# Patient Record
Sex: Female | Born: 1980 | Race: White | Hispanic: No | Marital: Married | Smoking: Former smoker
Health system: Southern US, Community
[De-identification: ages and names within clinical notes are randomized; demographics above are authoritative.]

## PROBLEM LIST (undated history)

## (undated) DIAGNOSIS — R569 Unspecified convulsions: Secondary | ICD-10-CM

---

## 2001-07-05 ENCOUNTER — Inpatient Hospital Stay (HOSPITAL_COMMUNITY): Admission: EM | Admit: 2001-07-05 | Discharge: 2001-07-08 | Payer: Self-pay | Admitting: Psychiatry

## 2002-08-19 ENCOUNTER — Emergency Department (HOSPITAL_COMMUNITY): Admission: EM | Admit: 2002-08-19 | Discharge: 2002-08-19 | Payer: Self-pay | Admitting: Emergency Medicine

## 2002-09-30 ENCOUNTER — Emergency Department (HOSPITAL_COMMUNITY): Admission: EM | Admit: 2002-09-30 | Discharge: 2002-09-30 | Payer: Self-pay | Admitting: Internal Medicine

## 2002-11-16 ENCOUNTER — Emergency Department (HOSPITAL_COMMUNITY): Admission: EM | Admit: 2002-11-16 | Discharge: 2002-11-16 | Payer: Self-pay | Admitting: Emergency Medicine

## 2007-11-15 ENCOUNTER — Other Ambulatory Visit: Admission: RE | Admit: 2007-11-15 | Discharge: 2007-11-15 | Payer: Self-pay | Admitting: Obstetrics and Gynecology

## 2008-01-21 ENCOUNTER — Ambulatory Visit (HOSPITAL_COMMUNITY): Admission: RE | Admit: 2008-01-21 | Discharge: 2008-01-21 | Payer: Self-pay | Admitting: Orthopaedic Surgery

## 2008-06-15 ENCOUNTER — Inpatient Hospital Stay (HOSPITAL_COMMUNITY): Admission: AD | Admit: 2008-06-15 | Discharge: 2008-06-15 | Payer: Self-pay | Admitting: Obstetrics & Gynecology

## 2008-07-04 ENCOUNTER — Inpatient Hospital Stay (HOSPITAL_COMMUNITY): Admission: AD | Admit: 2008-07-04 | Discharge: 2008-07-06 | Payer: Self-pay | Admitting: Obstetrics & Gynecology

## 2008-07-04 ENCOUNTER — Ambulatory Visit: Payer: Self-pay | Admitting: *Deleted
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
## 2011-05-02 NOTE — Discharge Summary (Signed)
[facility]  Patient:    Brianna Kim, Brianna Kim Visit Number: [id number] MRN: [id number]          Service Type: PSY Location: 40 [id number] 01 Attending Physician:  Rachael Fee Dictated by:   Netta Cedars, M.D. Admit Date:  07/05/2001 Discharge Date: 07/08/2001                             Discharge Summary  INTRODUCTION:  Brianna Kim is an 30 year old white female, who was admitted after overdose.  She reported overdosing on 20 pills.  It was allegedly Celebrex but she is not sure.  Her intention of taking the pills was to "pass out."  She denied, however, suicidal intention and denied her action as a suicidal attempt.  The patient reported having hallucinations and this was the reason she was brought to emergency room.  She has history of getting easily angered but able to control this anger by walking away.  Denied being depressed.  Denied suicidal or homicidal ideation or paranoia.  The patient does not have previous history of psychiatric treatment or hospitalizations.  SUBSTANCE ABUSE:  She smokes half pack of cigarettes for four years.  Does not drink.  Denies any substance abuse.  Urine drug screen shows positive for benzodiazepines and barbiturates, which could be related to the pills she ingested.  PHYSICAL EXAMINATION:  In emergency room was normal.  INITIAL DIAGNOSES:  Depression not otherwise specified with Global Assessment of Functioning of 40 and maximum for past year 75.  Details are available on admission note.  HOSPITAL COURSE:  After admission to the ward, patient was placed on special observation.  She was able to contract for safety.  The patient had some argument with mother prior to taking overdose.  During the round on July 06, 2001, she was pretty negativistic, drowsy, does not want to talk, still in angry mood, denies suicidal or homicidal ideation but accepts start taking medication for Celexa.  After agreeing to this, the  next day she refused to take Celexa, telling that her mother would not agree with it.  Later on this day, she seemed to be doing much better.  Acknowledged that maybe she was depressed but it was coming, according to her, from relationship with mother and conflict with boyfriend.  She had a good conversation with mother and felt that things will be better from now on.  I attempted to call the patients mother but her phone did not accept external calls.  I asked to schedule session with patients mother as soon as possible to consider discharging and further treatment.  Both patient and her mother acknowledged difficulty in relationship.  It seemed like this was an interesting dynamic.  The patients mother was very young when she had her and, as a result, their relationship is more like between sisters or friends, not between mother and daughter.  Mother did not seem to be able to establish authority and, when she tries to assert authority in a forceful way, patient in response to this rebels.  Both mother and daughter felt that it is safe for her to be discharged home.  The patient denied any symptoms.  She was not happy with the idea of taking medication and mother supported her.  She promised, however, to be open-minded.  If symptoms will not improve or deteriorate, she will start taking some medication.  It has to be stressed that patients mother is a  psychiatric patient, being on two types of psychotropic medications for time being.  During meeting with me, patient showed bright affect, denied depression, denied dangerous ideations. I had contact with her mother the day before and mother felt comfortable to meet with the patient and to take her home.  MEDICAL PROBLEMS:  During this brief hospital stay, patient did not develop any medical problems.  Vital signs were stable with blood pressure 110/70. Normal pulse, respiration rate and temperature.  LABORATORY DATA:  CBC showed  borderline anemia with hemoglobin 11.6 with normal starting from 12.  There was slight elevation of T3 uptake 39.2 with normal TSH and T4.  Liver function tests show initially, on July 13, increase of bilirubin normal on July 16.  Pregnancy test was negative.  DISCHARGE DIAGNOSES: Axis I:    1. Adjustment disorder with mixed emotion.            2. Suicidal gesture. Axis II:   No diagnosis. Axis III:  Status post overdose on unknown medication. Axis IV:   Moderate stressors (family conflict and separation from boyfriend). Axis V:    Global Assessment of Functioning upon admission 40; maximum for            past year 75; upon discharge between 70-75.  DISCHARGE RECOMMENDATIONS:  As mentioned before, at this point, patient refused medication but will consider them in the future.  She should call mental health if any deterioration of symptoms.  The patient has appointment with Dr. Katrinka Blazing on August 12 at 2 p.m. and Dottie _______ on Wednesday, August 7 at 2:15 p.m. for therapy.  She is also supposed to follow with her family physician because of borderline value of her blood work.  The patient understood instructions and, in good condition, was discharged home in care of her mother. Dictated by:   Netta Cedars, M.D. Attending Physician:  Rachael Fee DD:  09/17/01 TD:  09/18/01 Job: [id number]

## 2011-09-12 LAB — CBC
HCT: 24.9 — ABNORMAL LOW
HCT: 31.3 — ABNORMAL LOW
Hemoglobin: 8.3 — ABNORMAL LOW
MCHC: 33.9
MCV: 83.2
MCV: 84.1
Platelets: 180
Platelets: 231
RDW: 13.1
WBC: 17.2 — ABNORMAL HIGH

## 2011-09-12 LAB — RPR: RPR Ser Ql: NONREACTIVE

## 2011-09-30 ENCOUNTER — Emergency Department (HOSPITAL_COMMUNITY)
Admission: EM | Admit: 2011-09-30 | Discharge: 2011-10-01 | Disposition: A | Discharge status: Home / Self Care | Payer: Self-pay | Attending: Emergency Medicine | Admitting: Emergency Medicine

## 2011-09-30 DIAGNOSIS — N390 Urinary tract infection, site not specified: Secondary | ICD-10-CM | POA: Insufficient documentation

## 2011-09-30 NOTE — ED Notes (Signed)
Pt reports sharp pains to her pelvic area.  Pt states that it is more to the right side than her left.  Pt reports that her last menstrual cycle was abnormal.

## 2011-10-01 MED ORDER — NITROFURANTOIN MACROCRYSTAL 100 MG PO CAPS
ORAL_CAPSULE | ORAL | Status: AC
  Filled 2011-10-01: qty 1

## 2011-10-13 NOTE — ED Provider Notes (Signed)
History     CSN: [id number] Arrival date & time: 09/30/2011 11:46 PM   First MD Initiated Contact with Patient 10/01/11 0018      Chief Complaint  Patient presents with  . Pelvic Pain    (Consider location/radiation/quality/duration/timing/severity/associated sxs/prior treatment) HPI  History reviewed. No pertinent past medical history.  History reviewed. No pertinent past surgical history.  No family history on file.  History  Substance Use Topics  . Smoking status: Current Everyday Smoker  . Smokeless tobacco: Not on file  . Alcohol Use: Yes    OB History    Grav Para Term Preterm Abortions TAB SAB Ect Mult Living                  Review of Systems  Allergies  Review of patient's allergies indicates no known allergies.  Home Medications  No current outpatient prescriptions on file.  BP 107/72  Pulse 83  Temp(Src) 98.2 F (36.8 C) (Oral)  Resp 16  Ht 5\' 4"  (1.626 m)  Wt 114 lb (51.71 kg)  BMI 19.57 kg/m2  SpO2 100%  LMP 09/07/2011  Physical Exam  ED Course  Procedures (including critical care time)  Labs Reviewed - No data to display No results found.   No diagnosis found.    MDM  See downtime form        Nicoletta Dress. Colon Branch, MD 10/13/11 1625

## 2011-10-16 ENCOUNTER — Encounter (HOSPITAL_COMMUNITY): Payer: Self-pay | Admitting: *Deleted

## 2011-10-16 ENCOUNTER — Emergency Department (HOSPITAL_COMMUNITY)
Admission: EM | Admit: 2011-10-16 | Discharge: 2011-10-16 | Disposition: A | Discharge status: Home / Self Care | Payer: Self-pay | Attending: Emergency Medicine | Admitting: Emergency Medicine

## 2011-10-16 DIAGNOSIS — IMO0001 Reserved for inherently not codable concepts without codable children: Secondary | ICD-10-CM

## 2011-10-16 DIAGNOSIS — Z711 Person with feared health complaint in whom no diagnosis is made: Secondary | ICD-10-CM | POA: Insufficient documentation

## 2011-10-16 LAB — URINALYSIS, ROUTINE W REFLEX MICROSCOPIC
Bilirubin Urine: NEGATIVE
Glucose, UA: NEGATIVE mg/dL
Specific Gravity, Urine: 1.02 (ref 1.005–1.030)
Urobilinogen, UA: 0.2 mg/dL (ref 0.0–1.0)

## 2011-10-16 LAB — URINE MICROSCOPIC-ADD ON

## 2011-10-16 LAB — WET PREP, GENITAL: Yeast Wet Prep HPF POC: NONE SEEN

## 2011-10-16 MED ORDER — IBUPROFEN 600 MG PO TABS: 600.0000 mg | ORAL_TABLET | Freq: Four times a day (QID) | ORAL | Status: AC | PRN

## 2011-10-16 NOTE — ED Provider Notes (Signed)
Scribed for Nelia Shi, MD, the patient was seen in room APA08/APA08 . This chart was scribed by Ellie Lunch.   CSN: [id number] Arrival date & time: 10/16/2011  2:58 PM   First MD Initiated Contact with Patient 10/16/11 1501      Chief Complaint  Patient presents with  . Nausea  . Abdominal Pain     HPI Pt seen at 15:05 Brianna Kim is a 30 y.o. female who presents to the Emergency Department complaining of abdominal pain with associated nausea and dry heaving since this morning. Pain is located in lower abdomen, described as cramping, and rated 7/10 in severity. Pt also reports possible vaginal bleeding. Pt states she noticed "blood clots" this morning in the toilet bowel after she urinated. Pt LNMP 09/08/2011. Pt is unsure if she is pregnant.  Pt denies dysuria or fever. Denies any chronic medical condition. Pt has history of 2 previous pregnancies and one term birth. Pt is sexually active.   History reviewed. No pertinent past medical history.  History reviewed. No pertinent past surgical history.  No family history on file.  History  Substance Use Topics  . Smoking status: Current Everyday Smoker -- 0.5 packs/day    Types: Cigarettes  . Smokeless tobacco: Not on file  . Alcohol Use: Yes     occasionally    Review of Systems  Constitutional: Negative for fever.  Gastrointestinal: Positive for nausea, vomiting (dry heave) and abdominal pain.  Genitourinary: Negative for dysuria.  All other systems reviewed and are negative.    Allergies  Review of patient's allergies indicates no known allergies.  Home Medications  No current outpatient prescriptions on file.  BP 116/79  Pulse 76  Temp(Src) 98.1 F (36.7 C) (Oral)  Resp 18  Ht 5\' 4"  (1.626 m)  Wt 118 lb (53.524 kg)  BMI 20.25 kg/m2  SpO2 100%  LMP 09/07/2011  Physical Exam  Nursing note and vitals reviewed. Constitutional: She is oriented to person, place, and time. She appears well-developed and  well-nourished.  HENT:  Head: Normocephalic and atraumatic.  Eyes: EOM are normal. Pupils are equal, round, and reactive to light.  Neck: Neck supple.  Cardiovascular: Normal rate, regular rhythm and normal heart sounds.  Exam reveals no gallop and no friction rub.   No murmur heard. Pulmonary/Chest: Effort normal and breath sounds normal. No respiratory distress.  Abdominal: Soft. Bowel sounds are normal. There is no tenderness.  Genitourinary:       Small amount of blood present in vault and at entry.    External genitalia normal.  Bimanual examination normal.   Musculoskeletal: Normal range of motion. She exhibits no tenderness.  Neurological: She is alert and oriented to person, place, and time.  Skin: Skin is warm and dry.  Psychiatric: She has a normal mood and affect.    ED Course  Procedures (including critical care time) OTHER DATA REVIEWED: Nursing notes, vital signs, and past medical records reviewed.   DIAGNOSTIC STUDIES: Oxygen Saturation is 100% on room air, normal by my interpretation.     Labs Reviewed  URINALYSIS, ROUTINE W REFLEX MICROSCOPIC - Abnormal; Notable for the following:    Hgb urine dipstick LARGE (*)    All other components within normal limits  URINE MICROSCOPIC-ADD ON - Abnormal; Notable for the following:    Squamous Epithelial / LPF FEW (*)    Bacteria, UA FEW (*)    All other components within normal limits  PREGNANCY, URINE    No  diagnosis found.   MDM    I personally performed the services described in this documentation, which was scribed in my presence. The recorded information has been reviewed and considered.          Nelia Shi, MD 10/16/11 [phone]

## 2011-10-16 NOTE — ED Notes (Signed)
Pelvic exam performed by Dr. Radford Pax and assist by [facility] NT. Patient

## 2011-10-16 NOTE — ED Notes (Signed)
Pelvic exam performed by Dr.

## 2011-10-16 NOTE — ED Notes (Signed)
Pelvic exam performed by Dr. Radford Pax and assisted by Stephens Shire NT. Patient tolerated well. Patient was given a warm wash cloth and a pad upon leaving the room.

## 2011-10-16 NOTE — ED Notes (Signed)
C/o abd cramping and nausea onset this morning; states LMP 09/08/11; reports light amount of bleeding now and that she passed several blood clots this morning.

## 2011-11-23 ENCOUNTER — Emergency Department (HOSPITAL_COMMUNITY)
Admission: EM | Admit: 2011-11-23 | Discharge: 2011-11-23 | Discharge status: Against Medical Advice | Payer: Self-pay | Attending: Emergency Medicine | Admitting: Emergency Medicine

## 2011-11-23 ENCOUNTER — Encounter (HOSPITAL_COMMUNITY): Payer: Self-pay

## 2011-11-23 DIAGNOSIS — L989 Disorder of the skin and subcutaneous tissue, unspecified: Secondary | ICD-10-CM | POA: Insufficient documentation

## 2011-11-23 DIAGNOSIS — R21 Rash and other nonspecific skin eruption: Secondary | ICD-10-CM | POA: Insufficient documentation

## 2011-11-23 NOTE — ED Notes (Signed)
Pt presents with spot on back that appears red and scabbed over. Pt also has generalized rash. Pt states she has been feeling sick since she noticed the area on her back.

## 2011-11-23 NOTE — ED Notes (Signed)
Pt left AMA. Pt signed AMA form

## 2011-11-28 ENCOUNTER — Encounter (HOSPITAL_COMMUNITY): Payer: Self-pay

## 2011-11-28 ENCOUNTER — Emergency Department (HOSPITAL_COMMUNITY)
Admission: EM | Admit: 2011-11-28 | Discharge: 2011-11-28 | Disposition: A | Discharge status: Home / Self Care | Payer: Self-pay | Attending: Emergency Medicine | Admitting: Emergency Medicine

## 2011-11-28 DIAGNOSIS — R21 Rash and other nonspecific skin eruption: Secondary | ICD-10-CM | POA: Insufficient documentation

## 2011-11-28 DIAGNOSIS — F17200 Nicotine dependence, unspecified, uncomplicated: Secondary | ICD-10-CM | POA: Insufficient documentation

## 2011-11-28 MED ORDER — DIPHENHYDRAMINE HCL 25 MG PO CAPS
50.0000 mg | ORAL_CAPSULE | Freq: Once | ORAL | Status: AC
  Administered 2011-11-28: 50 mg via ORAL
  Filled 2011-11-28: qty 2

## 2011-11-28 MED ORDER — FAMOTIDINE 20 MG PO TABS
20.0000 mg | ORAL_TABLET | Freq: Once | ORAL | Status: AC
  Administered 2011-11-28: 20 mg via ORAL
  Filled 2011-11-28: qty 1

## 2011-11-28 MED ORDER — DOXYCYCLINE HYCLATE 100 MG PO CAPS: 100.0000 mg | ORAL_CAPSULE | Freq: Two times a day (BID) | ORAL | Status: AC

## 2011-11-28 MED ORDER — PREDNISONE 20 MG PO TABS
40.0000 mg | ORAL_TABLET | Freq: Once | ORAL | Status: AC
  Administered 2011-11-28: 40 mg via ORAL
  Filled 2011-11-28: qty 2

## 2011-11-28 MED ORDER — PREDNISONE 10 MG PO TABS: 20.0000 mg | ORAL_TABLET | Freq: Every day | ORAL | Status: DC

## 2011-11-28 NOTE — ED Notes (Signed)
Pt talking with Raiford Noble PA at present time

## 2011-11-28 NOTE — ED Notes (Signed)
Pt has rash to abd area, left wrist, bilateral underarms, pt has ?abscess area to lower back area, pt states that she started to break out in rash two weeks ago, c/o itching, has used otc medication without any relief, pt states that she has been using a fleece blanket and that her son used the same blanket,  her son broke out in a rash but his went away after a day,

## 2011-11-28 NOTE — ED Provider Notes (Signed)
History     CSN: [id number] Arrival date & time: 11/28/2011 11:14 AM   First MD Initiated Contact with Patient 11/28/11 1203      Chief Complaint  Patient presents with  . Rash    (Consider location/radiation/quality/duration/timing/severity/associated sxs/prior treatment) Patient is a 30 y.o. female presenting with rash. The history is provided by the patient. No language interpreter was used.  Rash  This is a new problem. Episode onset: ~ 2 weeks ago. The problem has been gradually improving. Associated with: circular lesion on lower back.  describes  as "large" target-like lesion that is improving.  thinks it could have been a spider or other insect.  ? tick. There has been no fever. The rash is present on the torso, right arm and left arm. The patient is experiencing no pain. The pain has been constant since onset. Associated symptoms include itching. Pertinent negatives include no blisters, no pain and no weeping. She has tried antihistamines for the symptoms. The treatment provided no relief.    History reviewed. No pertinent past medical history.  History reviewed. No pertinent past surgical history.  No family history on file.  History  Substance Use Topics  . Smoking status: Current Everyday Smoker -- 0.5 packs/day    Types: Cigarettes  . Smokeless tobacco: Not on file  . Alcohol Use: Yes     occasionally    OB History    Grav Para Term Preterm Abortions TAB SAB Ect Mult Living                  Review of Systems  Skin: Positive for itching and rash.  All other systems reviewed and are negative.    Allergies  Review of patient's allergies indicates no known allergies.  Home Medications   Current Outpatient Rx  Name Route Sig Dispense Refill  . ACETAMINOPHEN 500 MG PO TABS Oral Take 1,000 mg by mouth as needed. For pain       BP 113/65  Pulse 70  Temp(Src) 97.9 F (36.6 C) (Oral)  Resp 18  Ht 5\' 4"  (1.626 m)  Wt 104 lb (47.174 kg)  BMI 17.85  kg/m2  SpO2 100%  LMP 10/16/2011  Physical Exam  Nursing note and vitals reviewed. Constitutional: She is oriented to person, place, and time. She appears well-developed and well-nourished. No distress.  HENT:  Head: Normocephalic and atraumatic.  Eyes: EOM are normal.  Neck: Normal range of motion.  Cardiovascular: Normal rate, regular rhythm and normal heart sounds.   Pulmonary/Chest: Effort normal and breath sounds normal.  Abdominal: Soft. She exhibits no distension. There is no tenderness.  Musculoskeletal: Normal range of motion.       Back:  Neurological: She is alert and oriented to person, place, and time.  Skin: Skin is warm and dry.  Psychiatric: She has a normal mood and affect. Judgment normal.    ED Course  Procedures (including critical care time)  Labs Reviewed - No data to display No results found.   No diagnosis found.    MDM          Worthy Rancher, PA 11/28/11 1452

## 2011-11-28 NOTE — ED Provider Notes (Signed)
Medical screening examination/treatment/procedure(s) were performed by non-physician practitioner and as supervising physician I was immediately available for consultation/collaboration.   Benny Lennert, MD 11/28/11 1540

## 2011-11-28 NOTE — ED Notes (Signed)
Pt presents with generalized body rash. Pt states the only place it is not is on her legs. Pt states it started approx 14 days ago. Pt states she has used benadryl and cortisone cream but no relief.

## 2011-12-14 ENCOUNTER — Emergency Department (HOSPITAL_COMMUNITY)
Admission: EM | Admit: 2011-12-14 | Discharge: 2011-12-14 | Disposition: A | Discharge status: Home / Self Care | Payer: Medicaid Other | Attending: Emergency Medicine | Admitting: Emergency Medicine

## 2011-12-14 ENCOUNTER — Emergency Department (HOSPITAL_COMMUNITY): Payer: Medicaid Other

## 2011-12-14 ENCOUNTER — Encounter (HOSPITAL_COMMUNITY): Payer: Self-pay | Admitting: Emergency Medicine

## 2011-12-14 DIAGNOSIS — S01119A Laceration without foreign body of unspecified eyelid and periocular area, initial encounter: Secondary | ICD-10-CM | POA: Insufficient documentation

## 2011-12-14 DIAGNOSIS — K029 Dental caries, unspecified: Secondary | ICD-10-CM | POA: Insufficient documentation

## 2011-12-14 DIAGNOSIS — R220 Localized swelling, mass and lump, head: Secondary | ICD-10-CM | POA: Insufficient documentation

## 2011-12-14 DIAGNOSIS — IMO0002 Reserved for concepts with insufficient information to code with codable children: Secondary | ICD-10-CM

## 2011-12-14 DIAGNOSIS — S0081XA Abrasion of other part of head, initial encounter: Secondary | ICD-10-CM

## 2011-12-14 MED ORDER — BACITRACIN-NEOMYCIN-POLYMYXIN 400-5-5000 EX OINT
TOPICAL_OINTMENT | Freq: Once | CUTANEOUS | Status: AC
  Administered 2011-12-14: 1 via TOPICAL
  Filled 2011-12-14: qty 1

## 2011-12-14 NOTE — ED Notes (Signed)
MD at bedside. 
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
## 2011-12-14 NOTE — ED Notes (Signed)
Discharge instructions reviewed with pt, questions answered. Pt verbalized understanding.  

## 2011-12-18 NOTE — ED Provider Notes (Signed)
Medical screening examination/treatment/procedure(s) were performed by non-physician practitioner and as supervising physician I was immediately available for consultation/collaboration.  Toy Baker, MD 12/18/11 [phone]
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
## 2013-04-19 ENCOUNTER — Encounter (HOSPITAL_COMMUNITY): Payer: Self-pay | Admitting: *Deleted

## 2013-04-19 ENCOUNTER — Emergency Department (HOSPITAL_COMMUNITY)
Admission: EM | Admit: 2013-04-19 | Discharge: 2013-04-19 | Disposition: A | Discharge status: Home / Self Care | Payer: Medicaid Other | Attending: Emergency Medicine | Admitting: Emergency Medicine

## 2013-04-19 ENCOUNTER — Emergency Department (HOSPITAL_COMMUNITY): Payer: Medicaid Other

## 2013-04-19 DIAGNOSIS — G40909 Epilepsy, unspecified, not intractable, without status epilepticus: Secondary | ICD-10-CM | POA: Insufficient documentation

## 2013-04-19 DIAGNOSIS — R569 Unspecified convulsions: Secondary | ICD-10-CM

## 2013-04-19 DIAGNOSIS — N390 Urinary tract infection, site not specified: Secondary | ICD-10-CM | POA: Insufficient documentation

## 2013-04-19 DIAGNOSIS — Z87828 Personal history of other (healed) physical injury and trauma: Secondary | ICD-10-CM | POA: Insufficient documentation

## 2013-04-19 DIAGNOSIS — Z3202 Encounter for pregnancy test, result negative: Secondary | ICD-10-CM | POA: Insufficient documentation

## 2013-04-19 DIAGNOSIS — R51 Headache: Secondary | ICD-10-CM | POA: Insufficient documentation

## 2013-04-19 DIAGNOSIS — F17200 Nicotine dependence, unspecified, uncomplicated: Secondary | ICD-10-CM | POA: Insufficient documentation

## 2013-04-19 DIAGNOSIS — Z79899 Other long term (current) drug therapy: Secondary | ICD-10-CM | POA: Insufficient documentation

## 2013-04-19 HISTORY — DX: Unspecified convulsions: R56.9

## 2013-04-19 LAB — URINE MICROSCOPIC-ADD ON

## 2013-04-19 LAB — URINALYSIS, ROUTINE W REFLEX MICROSCOPIC
Bilirubin Urine: NEGATIVE
Nitrite: POSITIVE — AB
Specific Gravity, Urine: 1.025 (ref 1.005–1.030)
Urobilinogen, UA: 0.2 mg/dL (ref 0.0–1.0)

## 2013-04-19 LAB — CBC WITH DIFFERENTIAL/PLATELET
Basophils Absolute: 0.1 10*3/uL (ref 0.0–0.1)
Basophils Relative: 1 % (ref 0–1)
Eosinophils Absolute: 0.1 10*3/uL (ref 0.0–0.7)
Eosinophils Relative: 1 % (ref 0–5)
MCH: 29.9 pg (ref 26.0–34.0)
MCHC: 33.6 g/dL (ref 30.0–36.0)
MCV: 88.9 fL (ref 78.0–100.0)
Monocytes Absolute: 0.6 10*3/uL (ref 0.1–1.0)
Platelets: 275 10*3/uL (ref 150–400)
RDW: 13 % (ref 11.5–15.5)
WBC: 9.7 10*3/uL (ref 4.0–10.5)

## 2013-04-19 LAB — COMPREHENSIVE METABOLIC PANEL
ALT: 10 U/L (ref 0–35)
AST: 13 U/L (ref 0–37)
Calcium: 8.7 mg/dL (ref 8.4–10.5)
Creatinine, Ser: 1.03 mg/dL (ref 0.50–1.10)
Sodium: 141 mEq/L (ref 135–145)
Total Protein: 6.2 g/dL (ref 6.0–8.3)

## 2013-04-19 LAB — RAPID URINE DRUG SCREEN, HOSP PERFORMED
Amphetamines: NOT DETECTED
Barbiturates: NOT DETECTED
Benzodiazepines: POSITIVE — AB
Cocaine: NOT DETECTED

## 2013-04-19 MED ORDER — CEPHALEXIN 500 MG PO CAPS: 500.0000 mg | ORAL_CAPSULE | Freq: Four times a day (QID) | ORAL | Status: DC
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
## 2013-04-21 LAB — URINE CULTURE: Colony Count: >=100000

## 2013-04-22 ENCOUNTER — Telehealth (HOSPITAL_COMMUNITY): Payer: Self-pay | Admitting: Emergency Medicine

## 2013-04-22 NOTE — ED Notes (Signed)
Post ED Visit - Positive Culture Follow-up  Culture report reviewed by antimicrobial stewardship pharmacist: []  Wes Dulaney, Pharm.D., BCPS [x]  Celedonio Miyamoto, [address].D., BCPS []  Georgina Pillion, Pharm.D., BCPS []  [place], [address].D., BCPS, AAHIVP []  Estella Husk, Pharm.D., BCPS, AAHIV  Positive urine culture Treated with keflex, organism sensitive to the same and no further patient follow-up is required at this time.  Kylie A Holland 04/22/2013, 2:23 PM

## 2013-07-13 ENCOUNTER — Encounter (HOSPITAL_COMMUNITY): Payer: Self-pay | Admitting: Emergency Medicine

## 2013-07-13 ENCOUNTER — Emergency Department (HOSPITAL_COMMUNITY): Payer: Medicaid Other

## 2013-07-13 ENCOUNTER — Emergency Department (HOSPITAL_COMMUNITY)
Admission: EM | Admit: 2013-07-13 | Discharge: 2013-07-14 | Disposition: A | Discharge status: Home / Self Care | Payer: Medicaid Other | Attending: Emergency Medicine | Admitting: Emergency Medicine

## 2013-07-13 DIAGNOSIS — Z79899 Other long term (current) drug therapy: Secondary | ICD-10-CM | POA: Insufficient documentation

## 2013-07-13 DIAGNOSIS — R296 Repeated falls: Secondary | ICD-10-CM | POA: Insufficient documentation

## 2013-07-13 DIAGNOSIS — R569 Unspecified convulsions: Secondary | ICD-10-CM | POA: Insufficient documentation

## 2013-07-13 DIAGNOSIS — Z3202 Encounter for pregnancy test, result negative: Secondary | ICD-10-CM | POA: Insufficient documentation

## 2013-07-13 DIAGNOSIS — F17200 Nicotine dependence, unspecified, uncomplicated: Secondary | ICD-10-CM | POA: Insufficient documentation

## 2013-07-13 DIAGNOSIS — S0990XA Unspecified injury of head, initial encounter: Secondary | ICD-10-CM | POA: Insufficient documentation

## 2013-07-13 DIAGNOSIS — Y939 Activity, unspecified: Secondary | ICD-10-CM | POA: Insufficient documentation

## 2013-07-13 DIAGNOSIS — Y929 Unspecified place or not applicable: Secondary | ICD-10-CM | POA: Insufficient documentation

## 2013-07-13 LAB — CBC WITH DIFFERENTIAL/PLATELET
Basophils Absolute: 0 10*3/uL (ref 0.0–0.1)
Basophils Relative: 0 % (ref 0–1)
Eosinophils Absolute: 0.1 10*3/uL (ref 0.0–0.7)
Eosinophils Relative: 1 % (ref 0–5)
Lymphs Abs: 2 10*3/uL (ref 0.7–4.0)
MCH: 30 pg (ref 26.0–34.0)
MCV: 87 fL (ref 78.0–100.0)
Neutrophils Relative %: 70 % (ref 43–77)
Platelets: 223 10*3/uL (ref 150–400)
RBC: 4.6 MIL/uL (ref 3.87–5.11)
RDW: 12.7 % (ref 11.5–15.5)
WBC: 9.5 10*3/uL (ref 4.0–10.5)

## 2013-07-13 LAB — URINALYSIS, ROUTINE W REFLEX MICROSCOPIC
Nitrite: NEGATIVE
Protein, ur: NEGATIVE mg/dL
Specific Gravity, Urine: 1.02 (ref 1.005–1.030)
Urobilinogen, UA: 0.2 mg/dL (ref 0.0–1.0)

## 2013-07-13 LAB — ETHANOL: Alcohol, Ethyl (B): <11 mg/dL (ref 0–11)

## 2013-07-13 LAB — BASIC METABOLIC PANEL
Calcium: 9.3 mg/dL (ref 8.4–10.5)
GFR calc Af Amer: >90 mL/min (ref >90)
GFR calc non Af Amer: >90 mL/min (ref >90)
Glucose, Bld: 117 mg/dL — ABNORMAL HIGH (ref 70–99)
Potassium: 3.3 mEq/L — ABNORMAL LOW (ref 3.5–5.1)
Sodium: 134 mEq/L — ABNORMAL LOW (ref 135–145)

## 2013-07-13 LAB — PREGNANCY, URINE: Preg Test, Ur: NEGATIVE

## 2013-07-13 LAB — URINE MICROSCOPIC-ADD ON

## 2013-07-13 LAB — RAPID URINE DRUG SCREEN, HOSP PERFORMED
Amphetamines: NOT DETECTED
Opiates: POSITIVE — AB

## 2013-07-13 MED ORDER — PHENYTOIN SODIUM 50 MG/ML IJ SOLN
INTRAMUSCULAR | Status: AC
  Filled 2013-07-13: qty 20

## 2013-07-13 MED ORDER — PHENYTOIN SODIUM EXTENDED 100 MG PO CAPS: 100.0000 mg | ORAL_CAPSULE | Freq: Three times a day (TID) | ORAL | Status: DC

## 2013-07-13 MED ORDER — SODIUM CHLORIDE 0.9 % IV SOLN
1000.0000 mg | Freq: Once | INTRAVENOUS | Status: AC
  Administered 2013-07-13: 1000 mg via INTRAVENOUS
  Filled 2013-07-13: qty 20

## 2013-07-13 MED ORDER — SODIUM CHLORIDE 0.9 % IV SOLN
INTRAVENOUS | Status: DC
  Administered 2013-07-13: 1000 mL via INTRAVENOUS

## 2013-07-13 MED ORDER — ONDANSETRON HCL 4 MG/2ML IJ SOLN
4.0000 mg | Freq: Once | INTRAMUSCULAR | Status: AC
  Administered 2013-07-13: 4 mg via INTRAVENOUS
  Filled 2013-07-13: qty 2
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
## 2013-07-13 NOTE — ED Notes (Signed)
Able to get up to bedside commode for urine spec. Gait only slightly ataxic.

## 2013-07-13 NOTE — ED Notes (Signed)
Vomited moderate amt of clear liquid

## 2013-07-14 MED ORDER — ACETAMINOPHEN 500 MG PO TABS
1000.0000 mg | ORAL_TABLET | Freq: Once | ORAL | Status: AC
  Administered 2013-07-14: 1000 mg via ORAL
  Filled 2013-07-14: qty 2

## 2013-07-14 NOTE — ED Notes (Signed)
Calmer than upon admission. Less argumentative.

## 2013-07-14 NOTE — ED Notes (Signed)
Sat upright c/o dizziness. Dr strand aware and pt informed that this is probably a side effect of the dilantin. Pt and her family is aware that this should dissipate tonight. Instructed to let her physician know if this persists with the oral meds.

## 2013-12-18 ENCOUNTER — Encounter (HOSPITAL_COMMUNITY): Payer: Self-pay | Admitting: Emergency Medicine

## 2013-12-18 ENCOUNTER — Emergency Department (HOSPITAL_COMMUNITY)
Admission: EM | Admit: 2013-12-18 | Discharge: 2013-12-18 | Disposition: A | Discharge status: Home / Self Care | Payer: Medicaid Other | Attending: Emergency Medicine | Admitting: Emergency Medicine

## 2013-12-18 DIAGNOSIS — F29 Unspecified psychosis not due to a substance or known physiological condition: Secondary | ICD-10-CM | POA: Insufficient documentation

## 2013-12-18 DIAGNOSIS — Z3202 Encounter for pregnancy test, result negative: Secondary | ICD-10-CM | POA: Insufficient documentation

## 2013-12-18 DIAGNOSIS — G479 Sleep disorder, unspecified: Secondary | ICD-10-CM | POA: Insufficient documentation

## 2013-12-18 DIAGNOSIS — R55 Syncope and collapse: Secondary | ICD-10-CM | POA: Insufficient documentation

## 2013-12-18 DIAGNOSIS — Z79899 Other long term (current) drug therapy: Secondary | ICD-10-CM | POA: Insufficient documentation

## 2013-12-18 DIAGNOSIS — R569 Unspecified convulsions: Secondary | ICD-10-CM

## 2013-12-18 DIAGNOSIS — G40909 Epilepsy, unspecified, not intractable, without status epilepticus: Secondary | ICD-10-CM | POA: Insufficient documentation

## 2013-12-18 DIAGNOSIS — F17200 Nicotine dependence, unspecified, uncomplicated: Secondary | ICD-10-CM | POA: Insufficient documentation

## 2013-12-18 DIAGNOSIS — R112 Nausea with vomiting, unspecified: Secondary | ICD-10-CM

## 2013-12-18 DIAGNOSIS — R42 Dizziness and giddiness: Secondary | ICD-10-CM

## 2013-12-18 LAB — CBC WITH DIFFERENTIAL/PLATELET
BASOS ABS: 0 10*3/uL (ref 0.0–0.1)
BASOS PCT: 0 % (ref 0–1)
EOS ABS: 0.1 10*3/uL (ref 0.0–0.7)
Eosinophils Relative: 1 % (ref 0–5)
HCT: 38.7 % (ref 36.0–46.0)
HEMOGLOBIN: 13 g/dL (ref 12.0–15.0)
Lymphocytes Relative: 23 % (ref 12–46)
Lymphs Abs: 2.5 10*3/uL (ref 0.7–4.0)
MCH: 28.8 pg (ref 26.0–34.0)
MCHC: 33.6 g/dL (ref 30.0–36.0)
MCV: 85.8 fL (ref 78.0–100.0)
Monocytes Absolute: 0.5 10*3/uL (ref 0.1–1.0)
Monocytes Relative: 5 % (ref 3–12)
NEUTROS ABS: 7.4 10*3/uL (ref 1.7–7.7)
Neutrophils Relative %: 71 % (ref 43–77)
PLATELETS: 286 10*3/uL (ref 150–400)
RBC: 4.51 MIL/uL (ref 3.87–5.11)
RDW: 12.5 % (ref 11.5–15.5)
WBC: 10.5 10*3/uL (ref 4.0–10.5)

## 2013-12-18 LAB — COMPREHENSIVE METABOLIC PANEL
ALBUMIN: 4 g/dL (ref 3.5–5.2)
ALK PHOS: 44 U/L (ref 39–117)
ALT: 9 U/L (ref 0–35)
AST: 19 U/L (ref 0–37)
BILIRUBIN TOTAL: 0.3 mg/dL (ref 0.3–1.2)
BUN: 4 mg/dL — ABNORMAL LOW (ref 6–23)
CHLORIDE: 105 meq/L (ref 96–112)
CO2: 25 mEq/L (ref 19–32)
Calcium: 8.9 mg/dL (ref 8.4–10.5)
Creatinine, Ser: 0.79 mg/dL (ref 0.50–1.10)
GFR calc Af Amer: >90 mL/min (ref >90)
GFR calc non Af Amer: >90 mL/min (ref >90)
Glucose, Bld: 84 mg/dL (ref 70–99)
POTASSIUM: 3.9 meq/L (ref 3.7–5.3)
SODIUM: 140 meq/L (ref 137–147)
TOTAL PROTEIN: 6.7 g/dL (ref 6.0–8.3)

## 2013-12-18 LAB — URINALYSIS, ROUTINE W REFLEX MICROSCOPIC
Bilirubin Urine: NEGATIVE
GLUCOSE, UA: NEGATIVE mg/dL
HGB URINE DIPSTICK: NEGATIVE
KETONES UR: NEGATIVE mg/dL
Leukocytes, UA: NEGATIVE
Nitrite: NEGATIVE
PROTEIN: NEGATIVE mg/dL
Specific Gravity, Urine: 1.01 (ref 1.005–1.030)
Urobilinogen, UA: 0.2 mg/dL (ref 0.0–1.0)
pH: 6 (ref 5.0–8.0)

## 2013-12-18 LAB — PREGNANCY, URINE: Preg Test, Ur: NEGATIVE

## 2013-12-18 MED ORDER — ONDANSETRON HCL 4 MG/2ML IJ SOLN
4.0000 mg | Freq: Four times a day (QID) | INTRAMUSCULAR | Status: DC | PRN
  Administered 2013-12-18: 4 mg via INTRAVENOUS
  Filled 2013-12-18: qty 2

## 2013-12-18 MED ORDER — PHENYTOIN SODIUM EXTENDED 100 MG PO CAPS: 100.0000 mg | ORAL_CAPSULE | Freq: Three times a day (TID) | ORAL | Status: DC

## 2013-12-18 MED ORDER — SODIUM CHLORIDE 0.9 % IV SOLN
1000.0000 mL | Freq: Once | INTRAVENOUS | Status: AC
  Administered 2013-12-18: 1000 mL via INTRAVENOUS

## 2013-12-18 MED ORDER — MECLIZINE HCL 12.5 MG PO TABS
25.0000 mg | ORAL_TABLET | Freq: Once | ORAL | Status: AC
  Administered 2013-12-18: 25 mg via ORAL
  Filled 2013-12-18: qty 2

## 2013-12-18 MED ORDER — ONDANSETRON HCL 4 MG PO TABS: 4.0000 mg | ORAL_TABLET | Freq: Three times a day (TID) | ORAL | Status: DC | PRN

## 2013-12-18 MED ORDER — SODIUM CHLORIDE 0.9 % IV SOLN: 1000.0000 mL | INTRAVENOUS | Status: DC

## 2013-12-18 MED ORDER — MECLIZINE HCL 25 MG PO TABS: 25.0000 mg | ORAL_TABLET | Freq: Four times a day (QID) | ORAL | Status: DC | PRN

## 2013-12-18 NOTE — ED Provider Notes (Signed)
CSN: [id number]     Arrival date & time 12/18/13  1528 History  This chart was scribed for Ward Givens, MD by Bennett Scrape, ED Scribe. This patient was seen in room APA01/APA01 and the patient's care was started at 6:43 PM.   Chief Complaint  Patient presents with  . Seizures    The history is provided by the patient. No language interpreter was used.    HPI Comments: Brianna Kim is a 33 y.o. female who presents to the Emergency Department complaining of one witnessed seizure that occurred around 8 or 9 AM this morning.  She reports that she was possibly diagnosed with seizures 4 years ago. Her last seizure was in August 2014 and prior one was in July 2014. She states that she is usually unsure if she has seizures. If she is by herself, she will wake up with bruises or abrasions but does not remember. She reports that she has been using a prescription that was written by an ER doctor, last refill was October 21st, 2014 for 90 pills. She has been taking them PRN. Last dose was this morning after the seizure. She reports that she has not been taking her medication as prescribed due to trying to get pregnant and running out of pills. Pt admits that she did not sleep last night because she only had one dilantin pill left. She states that she was feeling anxiety over having a seizure without someone there. Spouse came home from work around 7:15 AM and pt and spouse laid down together to watch a movie. The scene had flashing lights and the pt reported feeling bad. The pt stood up and started stuttering. Spouse asked if she was ok and pt's stuttering increased. Spouse held the pt in his lap on the couch and told her to bite down on his shirt which she did. He states that she was staring "wide eyed", had generalized jerking and stiffness and was hyperventilating. The episode lasted about 3 to 5 minutes. He is unsure of any color changes stating that the lights were off at the time.  Spouse denies hitting  her head. Pt denies having any preceding symptoms. She c/o mild tongue soreness. She reports concerned over feeling dizzy for several hours after the seizure and reports having to think harder to answer questions. She reports that she was feeling nauseated and was vomiting last night. She denies any nausea currently.  She denies being evaluated by a neurologist or having a prior EEG performed. She does state she had a CT and MRI.  She denies being on any seizure medication until she was prescribed some after her first ER visit. She is a 1ppd smoker and occassional alcohol user. She denies drinking last night.   PC[facility] is [facility].    Past Medical History  Diagnosis Date  . Seizures    History reviewed. No pertinent past surgical history. No family history on file. History  Substance Use Topics  . Smoking status: Current Every Day Smoker -- 0.03 packs/day for 10 years    Types: Cigarettes  . Smokeless tobacco: Never Used  . Alcohol Use: Yes     Comment: occasionally   Employed Lives with significant other   OB History   Grav Para Term Preterm Abortions TAB SAB Ect Mult Living   2 1 1  1  1   1      Review of Systems  Gastrointestinal: Positive for nausea (resolved ) and vomiting (resolved ).  Neurological: Positive for seizures. Negative for headaches.  Psychiatric/Behavioral: Positive for confusion and sleep disturbance.  All other systems reviewed and are negative.    Allergies  Review of patient's allergies indicates no known allergies.  Home Medications   Current Outpatient Rx  Name  Route  Sig  Dispense  Refill  . phenytoin (DILANTIN) 100 MG ER capsule   Oral   Take 1 capsule (100 mg total) by mouth 3 (three) times daily.   90 capsule   2    Triage Vitals: BP 126/92  Pulse 84  Temp(Src) 98.5 F (36.9 C) (Oral)  Resp 20  Ht 5\' 4"  (1.626 m)  Wt 110 lb (49.896 kg)  BMI 18.87 kg/m2  SpO2 100%  LMP 11/23/2013  Vital signs normal     Physical Exam  Nursing note and vitals reviewed. Constitutional: She is oriented to person, place, and time. She appears well-developed and well-nourished.  Non-toxic appearance. She does not appear ill. No distress.  HENT:  Head: Normocephalic and atraumatic.  Right Ear: External ear normal.  Left Ear: External ear normal.  Nose: Nose normal. No mucosal edema or rhinorrhea.  Mouth/Throat: Oropharynx is clear and moist and mucous membranes are normal. No dental abscesses or uvula swelling.  No obvious trauma to her tongue but she c/o soreness of the left  Eyes: Conjunctivae and EOM are normal. Pupils are equal, round, and reactive to light.  Neck: Normal range of motion and full passive range of motion without pain. Neck supple.  Cardiovascular: Normal rate, regular rhythm and normal heart sounds.  Exam reveals no gallop and no friction rub.   No murmur heard. Pulmonary/Chest: Effort normal and breath sounds normal. No respiratory distress. She has no wheezes. She has no rhonchi. She has no rales. She exhibits no tenderness and no crepitus.  Abdominal: Soft. Normal appearance and bowel sounds are normal. She exhibits no distension. There is no tenderness. There is no rebound and no guarding.  Musculoskeletal: Normal range of motion. She exhibits no edema and no tenderness.  Moves all extremities well.   Neurological: She is alert and oriented to person, place, and time. She has normal strength. No cranial nerve deficit.  Skin: Skin is warm, dry and intact. No rash noted. No erythema. No pallor.  Psychiatric: She has a normal mood and affect. Her speech is normal and behavior is normal. Her mood appears not anxious.    ED Course  Procedures (including critical care time)  Medications  0.9 %  sodium chloride infusion (0 mLs Intravenous Stopped 12/18/13 2050)    Followed by  0.9 %  sodium chloride infusion (1,000 mLs Intravenous Not Given 12/18/13 2101)  ondansetron (ZOFRAN) injection 4  mg (4 mg Intravenous Given 12/18/13 1916)  meclizine (ANTIVERT) tablet 25 mg (25 mg Oral Given 12/18/13 2057)    DIAGNOSTIC STUDIES: Oxygen Saturation is 100% on RA, normal by my interpretation.    COORDINATION OF CARE: 7:00 PM-Discussed treatment plan which includes IV fluids, antiemetic and blood work with pt at bedside and pt agreed to plan.   8:40 PM- Pt rechecked and reports that she is having dizziness described as room spinning. Pt reports that she noticed the symptoms this morning but states that the symptoms have gotten worse. Discussed meclizine with pt and pt agreed. Advised pt to [facility] with [facility] for Dilantin prescription and will provide a referral to Neurology. Pt is agreeable.   Results for orders placed during the hospital encounter of 12/18/13  CBC  WITH DIFFERENTIAL      Result Value Range   WBC 10.5  4.0 - 10.5 K/uL   RBC 4.51  3.87 - 5.11 MIL/uL   Hemoglobin 13.0  12.0 - 15.0 g/dL   HCT 16.1  09.6 - 04.5 %   MCV 85.8  78.0 - 100.0 fL   MCH 28.8  26.0 - 34.0 pg   MCHC 33.6  30.0 - 36.0 g/dL   RDW 40.9  81.1 - 91.4 %   Platelets 286  150 - 400 K/uL   Neutrophils Relative % 71  43 - 77 %   Neutro Abs 7.4  1.7 - 7.7 K/uL   Lymphocytes Relative 23  12 - 46 %   Lymphs Abs 2.5  0.7 - 4.0 K/uL   Monocytes Relative 5  3 - 12 %   Monocytes Absolute 0.5  0.1 - 1.0 K/uL   Eosinophils Relative 1  0 - 5 %   Eosinophils Absolute 0.1  0.0 - 0.7 K/uL   Basophils Relative 0  0 - 1 %   Basophils Absolute 0.0  0.0 - 0.1 K/uL  COMPREHENSIVE METABOLIC PANEL      Result Value Range   Sodium 140  137 - 147 mEq/L   Potassium 3.9  3.7 - 5.3 mEq/L   Chloride 105  96 - 112 mEq/L   CO2 25  19 - 32 mEq/L   Glucose, Bld 84  70 - 99 mg/dL   BUN 4 (*) 6 - 23 mg/dL   Creatinine, Ser 7.82  0.50 - 1.10 mg/dL   Calcium 8.9  8.4 - 95.6 mg/dL   Total Protein 6.7  6.0 - 8.3 g/dL   Albumin 4.0  3.5 - 5.2 g/dL   AST 19  0 - 37 U/L   ALT 9  0 - 35 U/L   Alkaline Phosphatase 44  39 - 117  U/L   Total Bilirubin 0.3  0.3 - 1.2 mg/dL   GFR calc non Af Amer >90  >90 mL/min   GFR calc Af Amer >90  >90 mL/min  URINALYSIS, ROUTINE W REFLEX MICROSCOPIC      Result Value Range   Color, Urine YELLOW  YELLOW   APPearance CLEAR  CLEAR   Specific Gravity, Urine 1.010  1.005 - 1.030   pH 6.0  5.0 - 8.0   Glucose, UA NEGATIVE  NEGATIVE mg/dL   Hgb urine dipstick NEGATIVE  NEGATIVE   Bilirubin Urine NEGATIVE  NEGATIVE   Ketones, ur NEGATIVE  NEGATIVE mg/dL   Protein, ur NEGATIVE  NEGATIVE mg/dL   Urobilinogen, UA 0.2  0.0 - 1.0 mg/dL   Nitrite NEGATIVE  NEGATIVE   Leukocytes, UA NEGATIVE  NEGATIVE  PREGNANCY, URINE      Result Value Range   Preg Test, Ur NEGATIVE  NEGATIVE   Laboratory interpretation all normal      EKG Interpretation   None       MDM   1. Vertigo   2. Nausea and vomiting   3. Seizure    Discharge Medication List as of 12/18/2013  8:44 PM    START taking these medications   Details  meclizine (ANTIVERT) 25 MG tablet Take 1 tablet (25 mg total) by mouth every 6 (six) hours as needed for dizziness., Starting 12/18/2013, Until Discontinued, Print    ondansetron (ZOFRAN) 4 MG tablet Take 1 tablet (4 mg total) by mouth every 8 (eight) hours as needed for nausea or vomiting., Starting 12/18/2013, Until Discontinued, Print    !!  phenytoin (DILANTIN) 100 MG ER capsule Take 1 capsule (100 mg total) by mouth 3 (three) times daily., Starting 12/18/2013, Until Discontinued, Print     !! - Potential duplicate medications found. Please discuss with provider.      Plan discharge  Devoria Albe, MD, FACEP   I personally performed the services described in this documentation, which was scribed in my presence. The recorded information has been reviewed and considered.  Devoria Albe, MD, Armando Gang   Ward Givens, MD 12/18/13 [phone]

## 2013-12-18 NOTE — Discharge Instructions (Signed)
Follow up at the health department and with Dr Gerilyn Pilgrimoonquah about your seizures. Take the meclizine and zofran for dizziness and nasuea. Recheck as needed.  Vertigo Vertigo means you feel like you or your surroundings are moving when they are not. Vertigo can be dangerous if it occurs when you are at work, driving, or performing difficult activities.  CAUSES  Vertigo occurs when there is a conflict of signals sent to your brain from the visual and sensory systems in your body. There are many different causes of vertigo, including:  Infections, especially in the inner ear.  A bad reaction to a drug or misuse of alcohol and medicines.  Withdrawal from drugs or alcohol.  Rapidly changing positions, such as lying down or rolling over in bed.  A migraine headache.  Decreased blood flow to the brain.  Increased pressure in the brain from a head injury, infection, tumor, or bleeding. SYMPTOMS  You may feel as though the world is spinning around or you are falling to the ground. Because your balance is upset, vertigo can cause nausea and vomiting. You may have involuntary eye movements (nystagmus). DIAGNOSIS  Vertigo is usually diagnosed by physical exam. If the cause of your vertigo is unknown, your caregiver may perform imaging tests, such as an MRI scan (magnetic resonance imaging). TREATMENT  Most cases of vertigo resolve on their own, without treatment. Depending on the cause, your caregiver may prescribe certain medicines. If your vertigo is related to body position issues, your caregiver may recommend movements or procedures to correct the problem. In rare cases, if your vertigo is caused by certain inner ear problems, you may need surgery. HOME CARE INSTRUCTIONS   Follow your caregiver's instructions.  Avoid driving.  Avoid operating heavy machinery.  Avoid performing any tasks that would be dangerous to you or others during a vertigo episode.  Tell your caregiver if you notice that  certain medicines seem to be causing your vertigo. Some of the medicines used to treat vertigo episodes can actually make them worse in some people. SEEK IMMEDIATE MEDICAL CARE IF:   Your medicines do not relieve your vertigo or are making it worse.  You develop problems with talking, walking, weakness, or using your arms, hands, or legs.  You develop severe headaches.  Your nausea or vomiting continues or gets worse.  You develop visual changes.  A family member notices behavioral changes.  Your condition gets worse. MAKE SURE YOU:  Understand these instructions.  Will watch your condition.  Will get help right away if you are not doing well or get worse. Document Released: 09/10/2005 Document Revised: 02/23/2012 Document Reviewed: 06/19/2011 [facility] Patient Information 2014 [place], [place].

## 2013-12-18 NOTE — ED Notes (Addendum)
Pt states she had a seizure at about 0830. Pt takes seizure meds on an as needed basis as was told and last took meds 2 weeks ago. States it has been difficulty to function normally since the seizure. Pt has Rx for Phenytoin. States she usually don't feel as dizzy as today after having seizure. States she stopped taking Phenytoin because she is trying to get pregnant.

## 2014-06-22 IMAGING — CT CT HEAD W/O CM
1 series · 16 of 30 positions shown, 20 images · non-contrast
Comparison: 04/19/2013

CLINICAL DATA: Seizure, head injury and headache.

CT HEAD WITHOUT CONTRAST
TECHNIQUE: Contiguous axial images were obtained from the base of
the skull through the vertex without contrast.

[Series 2: headtrauma 4.8 h37s · axial · 0.43mm/px · z∈[+69,+221]mm · 16 of 36 slices shown, 20 images]
[im 2/36  brain]
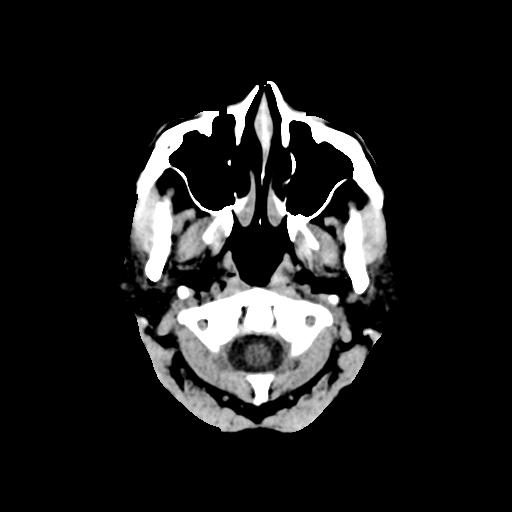
[im 2/36  bone]
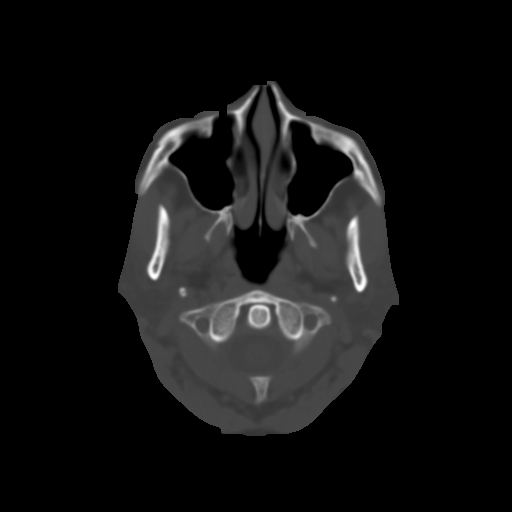
[im 4/36  brain]
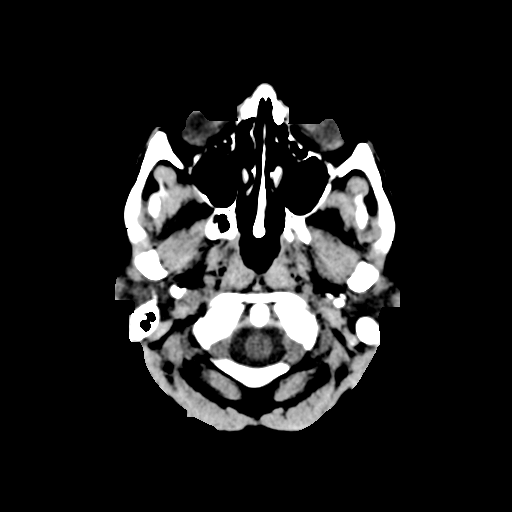
[im 7/36  brain]
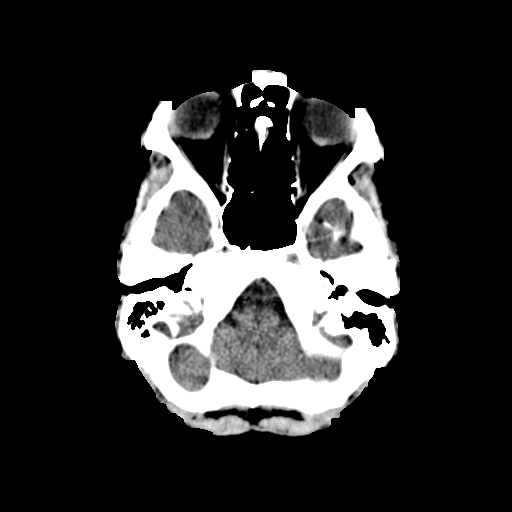
[im 9/36  brain]
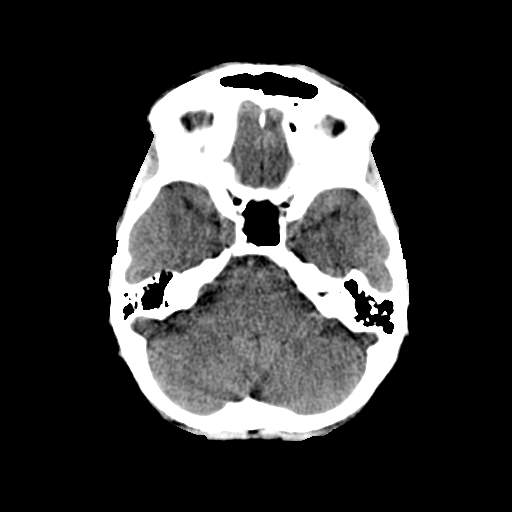
[im 10/36  brain]
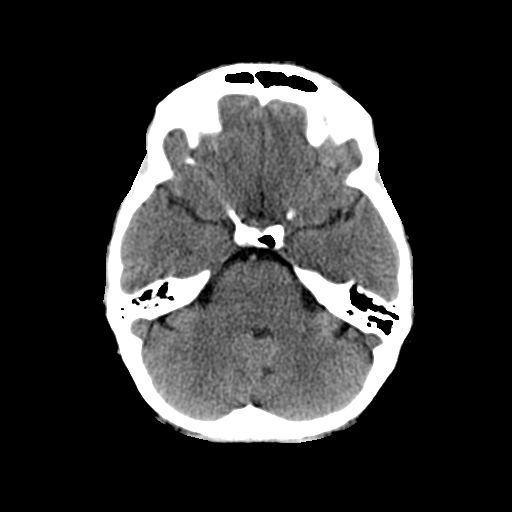
[im 10/36  bone]
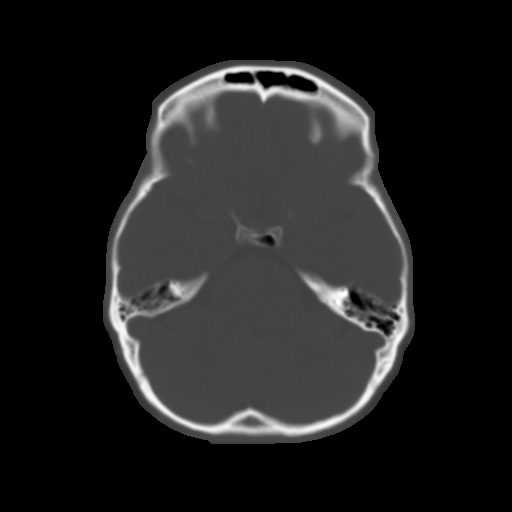
[im 13/36  brain]
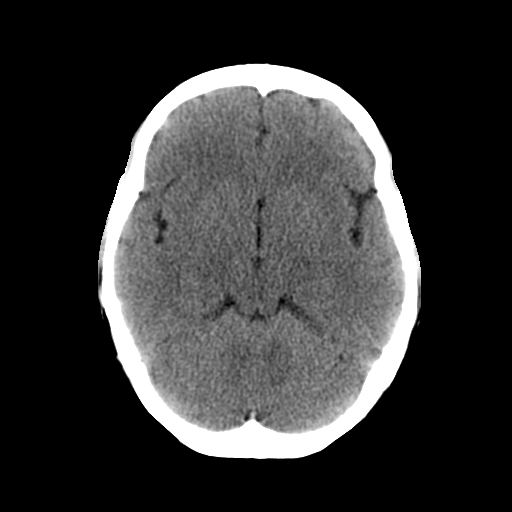
[im 15/36  brain]
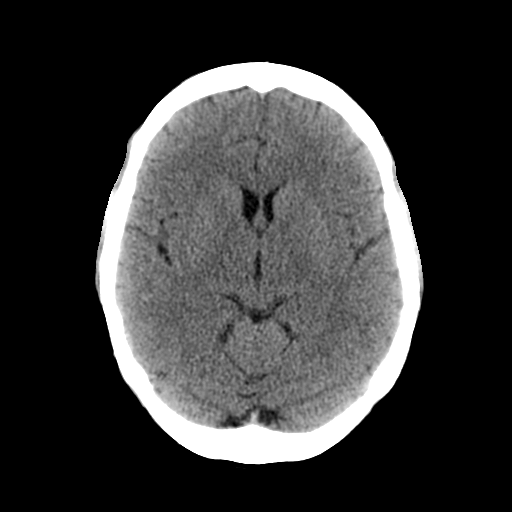
[im 17/36  brain]
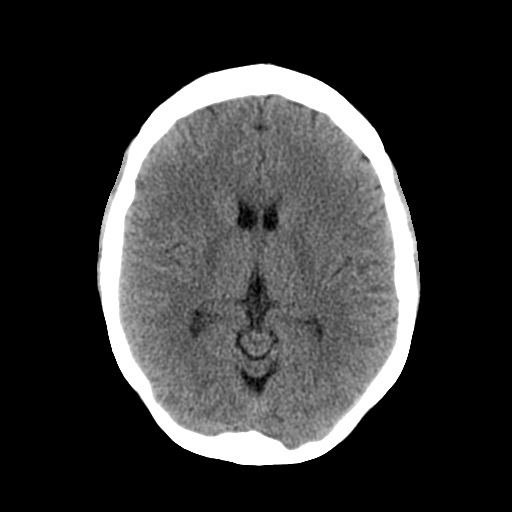
[im 19/36  brain]
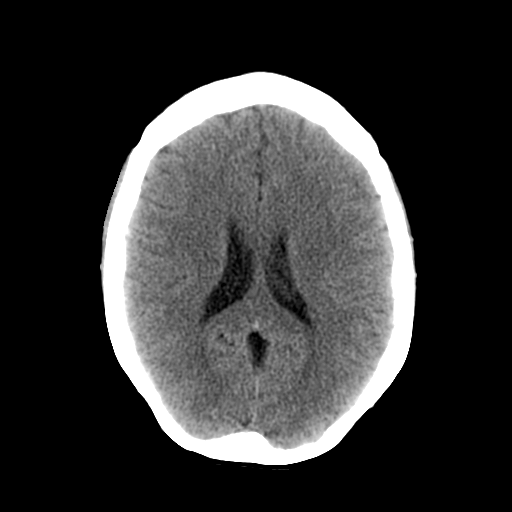
[im 19/36  bone]
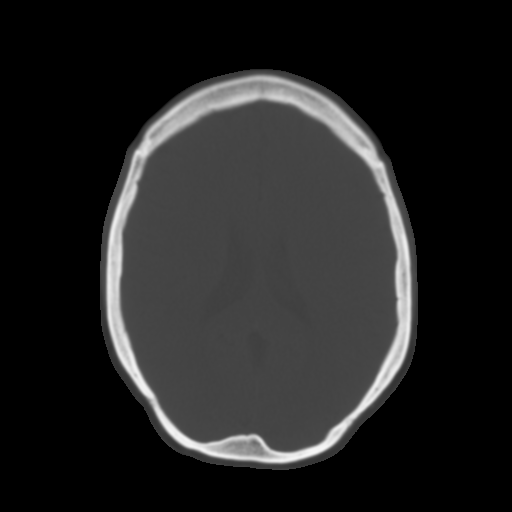
[im 21/36  brain]
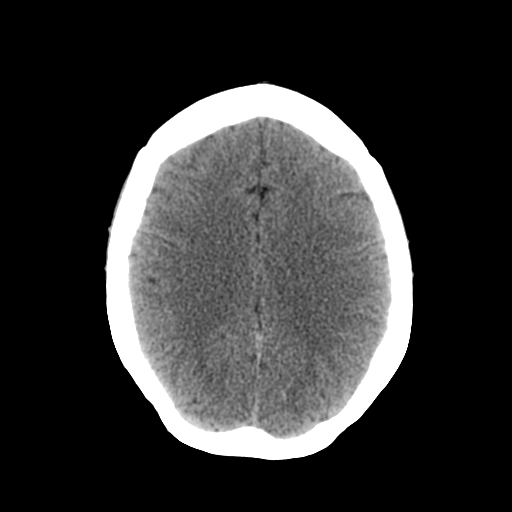
[im 23/36  brain]
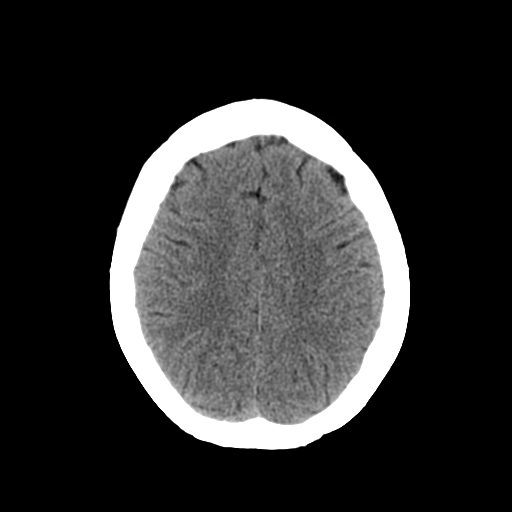
[im 26/36  brain]
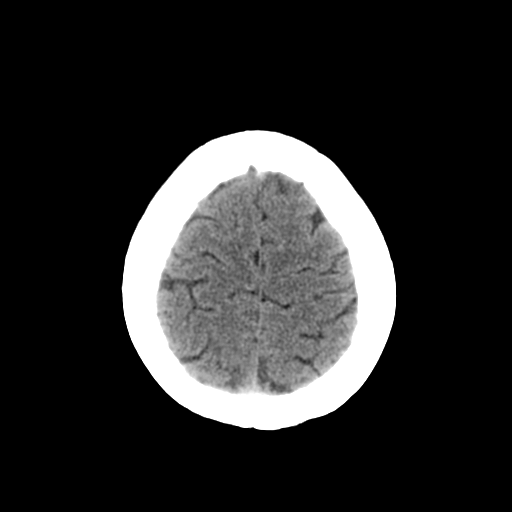
[im 27/36  brain]
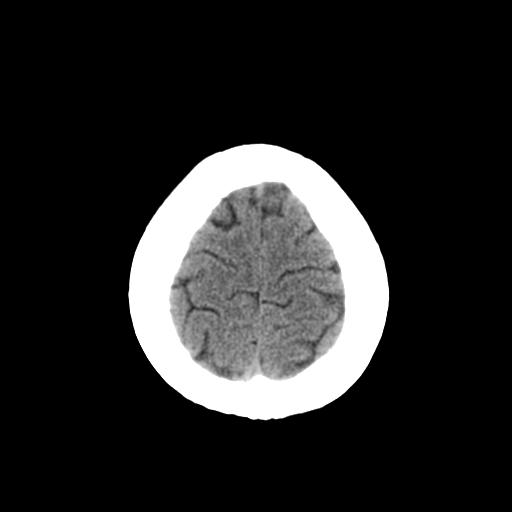
[im 27/36  bone]
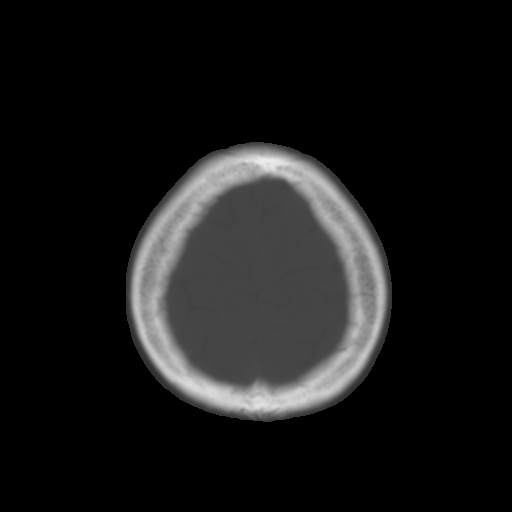
[im 29/36  brain]
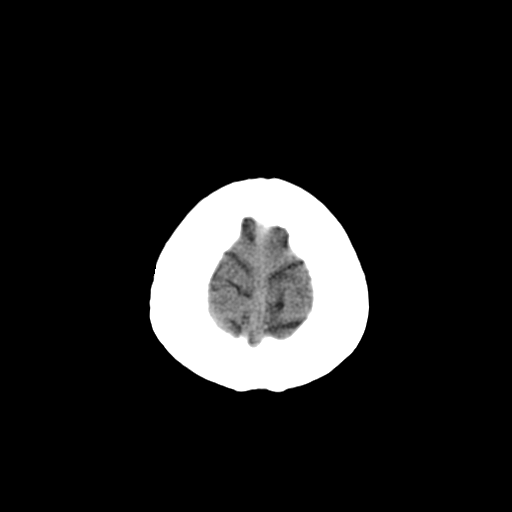
[im 32/36  brain]
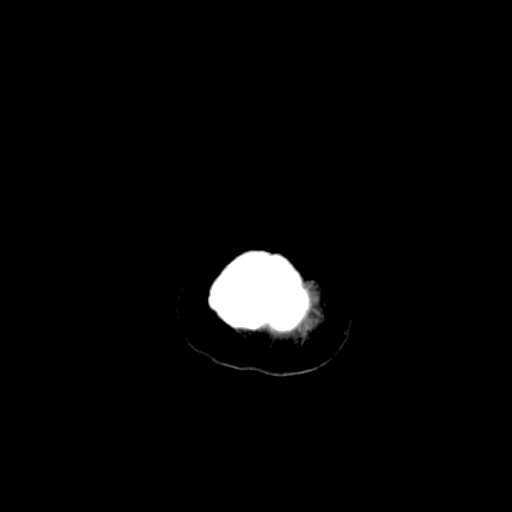
[im 34/36  brain]
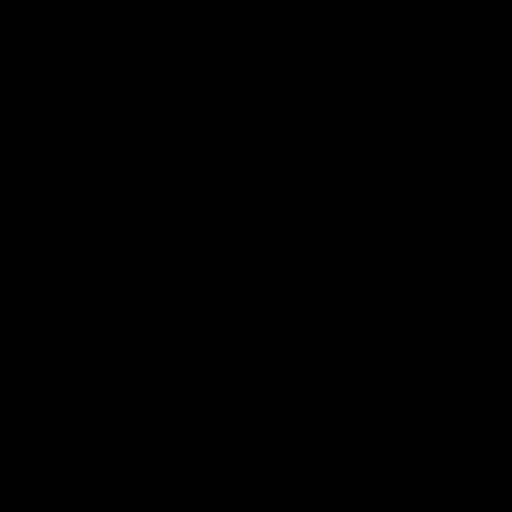

[16 of 30 positions shown; findings below may reference images not displayed]

FINDINGS: The brain demonstrates no evidence of hemorrhage,
infarction, edema, mass effect, extra-axial fluid collection,
hydrocephalus or mass lesion.  The skull is unremarkable.
IMPRESSION: Stable and normal head CT.

## 2014-10-16 ENCOUNTER — Encounter (HOSPITAL_COMMUNITY): Payer: Self-pay | Admitting: Emergency Medicine

## 2015-11-04 ENCOUNTER — Encounter (HOSPITAL_COMMUNITY): Payer: Self-pay | Admitting: *Deleted

## 2015-11-04 ENCOUNTER — Emergency Department (HOSPITAL_COMMUNITY): Payer: No Typology Code available for payment source

## 2015-11-04 ENCOUNTER — Emergency Department (HOSPITAL_COMMUNITY)
Admission: EM | Admit: 2015-11-04 | Discharge: 2015-11-04 | Disposition: A | Discharge status: Home / Self Care | Payer: No Typology Code available for payment source | Attending: Emergency Medicine | Admitting: Emergency Medicine

## 2015-11-04 DIAGNOSIS — Z79899 Other long term (current) drug therapy: Secondary | ICD-10-CM | POA: Diagnosis not present

## 2015-11-04 DIAGNOSIS — S3991XA Unspecified injury of abdomen, initial encounter: Secondary | ICD-10-CM | POA: Insufficient documentation

## 2015-11-04 DIAGNOSIS — S3992XA Unspecified injury of lower back, initial encounter: Secondary | ICD-10-CM | POA: Insufficient documentation

## 2015-11-04 DIAGNOSIS — Z87891 Personal history of nicotine dependence: Secondary | ICD-10-CM | POA: Insufficient documentation

## 2015-11-04 DIAGNOSIS — Y9389 Activity, other specified: Secondary | ICD-10-CM | POA: Diagnosis not present

## 2015-11-04 DIAGNOSIS — S4992XA Unspecified injury of left shoulder and upper arm, initial encounter: Secondary | ICD-10-CM | POA: Diagnosis not present

## 2015-11-04 DIAGNOSIS — S2232XA Fracture of one rib, left side, initial encounter for closed fracture: Secondary | ICD-10-CM

## 2015-11-04 DIAGNOSIS — Y9289 Other specified places as the place of occurrence of the external cause: Secondary | ICD-10-CM | POA: Diagnosis not present

## 2015-11-04 DIAGNOSIS — Y998 Other external cause status: Secondary | ICD-10-CM | POA: Insufficient documentation

## 2015-11-04 DIAGNOSIS — S299XXA Unspecified injury of thorax, initial encounter: Secondary | ICD-10-CM | POA: Diagnosis present

## 2015-11-04 MED ORDER — HYDROCODONE-ACETAMINOPHEN 5-325 MG PO TABS: 1.0000 | ORAL_TABLET | ORAL | Status: DC | PRN

## 2015-11-04 MED ORDER — IBUPROFEN 400 MG PO TABS
600.0000 mg | ORAL_TABLET | Freq: Once | ORAL | Status: AC
  Administered 2015-11-04: 600 mg via ORAL
  Filled 2015-11-04: qty 2

## 2015-11-04 MED ORDER — MORPHINE SULFATE (PF) 4 MG/ML IV SOLN
4.0000 mg | Freq: Once | INTRAVENOUS | Status: AC
  Administered 2015-11-04: 4 mg via INTRAMUSCULAR
  Filled 2015-11-04: qty 1

## 2015-11-04 MED ORDER — DIAZEPAM 5 MG PO TABS
2.5000 mg | ORAL_TABLET | Freq: Once | ORAL | Status: AC
  Administered 2015-11-04: 2.5 mg via ORAL
  Filled 2015-11-04: qty 1

## 2015-11-04 NOTE — ED Notes (Signed)
RT paged for incentive spirometer

## 2015-11-04 NOTE — ED Notes (Signed)
MD Kohut at bedside updating patient and family. 

## 2015-11-04 NOTE — ED Notes (Signed)
MD Kohut at bedside. 
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
## 2015-11-04 NOTE — Discharge Instructions (Signed)
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
## 2015-11-04 NOTE — ED Notes (Signed)
RT at bedside.

## 2017-07-20 ENCOUNTER — Other Ambulatory Visit: Payer: Self-pay | Admitting: Obstetrics and Gynecology

## 2017-07-30 ENCOUNTER — Other Ambulatory Visit: Payer: Self-pay | Admitting: Adult Health

## 2017-08-10 ENCOUNTER — Encounter: Payer: Self-pay | Admitting: *Deleted

## 2017-08-10 ENCOUNTER — Other Ambulatory Visit: Payer: Self-pay | Admitting: Adult Health

## 2018-03-26 ENCOUNTER — Ambulatory Visit: Payer: Self-pay | Admitting: Internal Medicine

## 2018-03-29 ENCOUNTER — Encounter: Payer: Self-pay | Admitting: *Deleted

## 2018-03-29 ENCOUNTER — Ambulatory Visit: Payer: Self-pay | Admitting: Internal Medicine

## 2018-03-30 ENCOUNTER — Encounter: Payer: Self-pay | Admitting: Internal Medicine

## 2018-10-08 ENCOUNTER — Observation Stay (HOSPITAL_COMMUNITY)
Admission: EM | Admit: 2018-10-08 | Discharge: 2018-10-10 | Payer: Medicaid Other | Attending: Family Medicine | Admitting: Family Medicine

## 2018-10-08 DIAGNOSIS — G40909 Epilepsy, unspecified, not intractable, without status epilepticus: Secondary | ICD-10-CM

## 2018-10-08 DIAGNOSIS — T50902A Poisoning by unspecified drugs, medicaments and biological substances, intentional self-harm, initial encounter: Secondary | ICD-10-CM | POA: Diagnosis present

## 2018-10-08 DIAGNOSIS — F419 Anxiety disorder, unspecified: Secondary | ICD-10-CM | POA: Insufficient documentation

## 2018-10-08 DIAGNOSIS — Z79899 Other long term (current) drug therapy: Secondary | ICD-10-CM | POA: Diagnosis not present

## 2018-10-08 DIAGNOSIS — F1910 Other psychoactive substance abuse, uncomplicated: Secondary | ICD-10-CM | POA: Diagnosis present

## 2018-10-08 DIAGNOSIS — T43292A Poisoning by other antidepressants, intentional self-harm, initial encounter: Secondary | ICD-10-CM | POA: Diagnosis not present

## 2018-10-08 DIAGNOSIS — R45851 Suicidal ideations: Secondary | ICD-10-CM

## 2018-10-08 DIAGNOSIS — Z87891 Personal history of nicotine dependence: Secondary | ICD-10-CM | POA: Diagnosis not present

## 2018-10-08 DIAGNOSIS — F329 Major depressive disorder, single episode, unspecified: Secondary | ICD-10-CM | POA: Insufficient documentation

## 2018-10-08 DIAGNOSIS — F32A Depression, unspecified: Secondary | ICD-10-CM | POA: Diagnosis present

## 2018-10-08 LAB — COMPREHENSIVE METABOLIC PANEL
ALK PHOS: 45 U/L (ref 38–126)
ALT: 14 U/L (ref 0–44)
AST: 16 U/L (ref 15–41)
Albumin: 3.5 g/dL (ref 3.5–5.0)
Anion gap: 5 (ref 5–15)
BUN: 11 mg/dL (ref 6–20)
CO2: 23 mmol/L (ref 22–32)
Calcium: 8.4 mg/dL — ABNORMAL LOW (ref 8.9–10.3)
Chloride: 114 mmol/L — ABNORMAL HIGH (ref 98–111)
Creatinine, Ser: 0.93 mg/dL (ref 0.44–1.00)
GFR calc non Af Amer: >60 mL/min (ref >60)
Glucose, Bld: 107 mg/dL — ABNORMAL HIGH (ref 70–99)
Potassium: 3.6 mmol/L (ref 3.5–5.1)
SODIUM: 142 mmol/L (ref 135–145)
Total Bilirubin: 0.3 mg/dL (ref 0.3–1.2)
Total Protein: 6.2 g/dL — ABNORMAL LOW (ref 6.5–8.1)

## 2018-10-08 LAB — URINALYSIS, ROUTINE W REFLEX MICROSCOPIC
Bilirubin Urine: NEGATIVE
Glucose, UA: NEGATIVE mg/dL
Hgb urine dipstick: NEGATIVE
Ketones, ur: NEGATIVE mg/dL
Nitrite: NEGATIVE
PH: 6 (ref 5.0–8.0)
PROTEIN: NEGATIVE mg/dL
Specific Gravity, Urine: 1.01 (ref 1.005–1.030)

## 2018-10-08 LAB — PHENYTOIN LEVEL, TOTAL: Phenytoin Lvl: <2.5 ug/mL — ABNORMAL LOW (ref 10.0–20.0)

## 2018-10-08 LAB — CBC WITH DIFFERENTIAL/PLATELET
ABS IMMATURE GRANULOCYTES: 0.03 10*3/uL (ref 0.00–0.07)
BASOS PCT: 1 %
Basophils Absolute: 0.1 10*3/uL (ref 0.0–0.1)
EOS ABS: 0.2 10*3/uL (ref 0.0–0.5)
EOS PCT: 2 %
HCT: 33.9 % — ABNORMAL LOW (ref 36.0–46.0)
Hemoglobin: 10.5 g/dL — ABNORMAL LOW (ref 12.0–15.0)
Immature Granulocytes: 0 %
Lymphocytes Relative: 23 %
Lymphs Abs: 2.2 10*3/uL (ref 0.7–4.0)
MCH: 28 pg (ref 26.0–34.0)
MCHC: 31 g/dL (ref 30.0–36.0)
MCV: 90.4 fL (ref 80.0–100.0)
MONOS PCT: 6 %
Monocytes Absolute: 0.5 10*3/uL (ref 0.1–1.0)
Neutro Abs: 6.6 10*3/uL (ref 1.7–7.7)
Neutrophils Relative %: 68 %
Platelets: 293 10*3/uL (ref 150–400)
RBC: 3.75 MIL/uL — AB (ref 3.87–5.11)
RDW: 14.3 % (ref 11.5–15.5)
WBC: 9.7 10*3/uL (ref 4.0–10.5)
nRBC: 0 % (ref 0.0–0.2)

## 2018-10-08 LAB — RAPID URINE DRUG SCREEN, HOSP PERFORMED
Amphetamines: NOT DETECTED
BARBITURATES: NOT DETECTED
BENZODIAZEPINES: POSITIVE — AB
Cocaine: NOT DETECTED
Opiates: POSITIVE — AB
Tetrahydrocannabinol: POSITIVE — AB

## 2018-10-08 LAB — ACETAMINOPHEN LEVEL

## 2018-10-08 LAB — PROTIME-INR
INR: 0.93
PROTHROMBIN TIME: 12.4 s (ref 11.4–15.2)

## 2018-10-08 LAB — SALICYLATE LEVEL

## 2018-10-08 LAB — ETHANOL: Alcohol, Ethyl (B): <10 mg/dL (ref <10)

## 2018-10-08 LAB — PREGNANCY, URINE: Preg Test, Ur: NEGATIVE

## 2018-10-08 MED ORDER — CHARCOAL ACTIVATED PO LIQD
50.0000 g | Freq: Once | ORAL | Status: AC
  Administered 2018-10-08: 50 g via ORAL
  Filled 2018-10-08: qty 240

## 2018-10-08 MED ORDER — SODIUM CHLORIDE 0.9 % IV SOLN
INTRAVENOUS | Status: DC
  Administered 2018-10-08 – 2018-10-09 (×3): via INTRAVENOUS

## 2018-10-08 MED ORDER — LORAZEPAM 2 MG/ML IJ SOLN
0.5000 mg | INTRAMUSCULAR | Status: DC | PRN
  Administered 2018-10-09: 0.5 mg via INTRAVENOUS
  Filled 2018-10-08: qty 1

## 2018-10-08 MED ORDER — POLYETHYLENE GLYCOL 3350 17 G PO PACK: 17.0000 g | PACK | Freq: Every day | ORAL | Status: DC | PRN

## 2018-10-08 MED ORDER — ONDANSETRON HCL 4 MG PO TABS: 4.0000 mg | ORAL_TABLET | Freq: Four times a day (QID) | ORAL | Status: DC | PRN

## 2018-10-08 MED ORDER — SODIUM CHLORIDE 0.9 % IV BOLUS
1000.0000 mL | Freq: Once | INTRAVENOUS | Status: AC
  Administered 2018-10-08: 1000 mL via INTRAVENOUS

## 2018-10-08 MED ORDER — NICOTINE 7 MG/24HR TD PT24: 7.0000 mg | MEDICATED_PATCH | Freq: Every day | TRANSDERMAL | Status: DC

## 2018-10-08 MED ORDER — ONDANSETRON HCL 4 MG/2ML IJ SOLN: 4.0000 mg | Freq: Four times a day (QID) | INTRAMUSCULAR | Status: DC | PRN

## 2018-10-08 MED ORDER — ACETAMINOPHEN 650 MG RE SUPP: 650.0000 mg | Freq: Four times a day (QID) | RECTAL | Status: DC | PRN

## 2018-10-08 MED ORDER — ACETAMINOPHEN 325 MG PO TABS
650.0000 mg | ORAL_TABLET | Freq: Four times a day (QID) | ORAL | Status: DC | PRN
  Administered 2018-10-09 (×3): 650 mg via ORAL
  Filled 2018-10-08 (×4): qty 2

## 2018-10-08 NOTE — ED Triage Notes (Signed)
Pt admits to taking "a handful" of wellbutrin 150 mg

## 2018-10-08 NOTE — ED Notes (Signed)
Pt sleeping. Nad. Chest rise and fall noted. Sitter at bedside.  

## 2018-10-08 NOTE — ED Notes (Signed)
Pt initially refused to drink charcoal. I advised what it does and how it helps. Pt agreed to drink a little. Pt drank approx 2/3 of charcoal and vomited back up.edp aware.

## 2018-10-08 NOTE — ED Notes (Signed)
Pt comforted and pt more calm at this time. Sitter at bedside.

## 2018-10-08 NOTE — ED Provider Notes (Signed)
[facility] EMERGENCY DEPARTMENT Provider Note   CSN: [id number] Arrival date & time: 10/08/18  1956     History   Chief Complaint Chief Complaint  Patient presents with  . Drug Overdose    HPI Brianna Kim is a 37 y.o. female.  HPI  Pt was seen at 2000. Per EMS and pt report: Pt c/o gradual onset and worsening of persistent depression and agitation for the past 1 to 2 months. Symptoms began after her "doctor started changing around my medications." Pt states she decided to take "a handful" of Wellbutrin PTA in SA. Pt states she "probably took at least 20." Pt states her husband found the bottle and called EMS. Pt states she now is "just really angry and want to strangle somebody." Endorses hx of previous intentional OD/SA when she was 37 years old.  Denies HI, no AVH. Denies CP/SOB, no abd pain, no N/V/D.    Past Medical History:  Diagnosis Date  . Seizures (HCC)     There are no active problems to display for this patient.   No past surgical history on file.   OB History    Gravida  2   Para  1   Term  1   Preterm      AB  1   Living  1     SAB  1   TAB      Ectopic      Multiple      Live Births               Home Medications    Prior to Admission medications   Medication Sig Start Date End Date Taking? Authorizing Provider  HYDROcodone-acetaminophen (NORCO/VICODIN) 5-325 MG tablet Take 1 tablet by mouth every 4 (four) hours as needed. 11/04/15   Raeford Razor, MD  meclizine (ANTIVERT) 25 MG tablet Take 1 tablet (25 mg total) by mouth every 6 (six) hours as needed for dizziness. 12/18/13   Devoria Albe, MD  ondansetron (ZOFRAN) 4 MG tablet Take 1 tablet (4 mg total) by mouth every 8 (eight) hours as needed for nausea or vomiting. 12/18/13   Devoria Albe, MD  phenytoin (DILANTIN) 100 MG ER capsule Take 1 capsule (100 mg total) by mouth 3 (three) times daily. 07/13/13   Gilda Crease, MD  phenytoin (DILANTIN) 100 MG ER capsule Take 1 capsule  (100 mg total) by mouth 3 (three) times daily. 12/18/13   Devoria Albe, MD    Family History No family history on file.  Social History Social History   Tobacco Use  . Smoking status: Former Smoker    Packs/day: 0.03    Years: 0.00    Pack years: 0.00  . Smokeless tobacco: Never Used  Substance Use Topics  . Alcohol use: No  . Drug use: No     Allergies   Patient has no known allergies.   Review of Systems Review of Systems ROS: Statement: All systems negative except as marked or noted in the HPI; Constitutional: Negative for fever and chills. ; ; Eyes: Negative for eye pain, redness and discharge. ; ; ENMT: Negative for ear pain, hoarseness, nasal congestion, sinus pressure and sore throat. ; ; Cardiovascular: Negative for chest pain, palpitations, diaphoresis, dyspnea and peripheral edema. ; ; Respiratory: Negative for cough, wheezing and stridor. ; ; Gastrointestinal: Negative for nausea, vomiting, diarrhea, abdominal pain, blood in stool, hematemesis, jaundice and rectal bleeding. . ; ; Genitourinary: Negative for dysuria, flank pain and hematuria. ; ;  Musculoskeletal: Negative for back pain and neck pain. Negative for swelling and trauma.; ; Skin: Negative for pruritus, rash, abrasions, blisters, bruising and skin lesion.; ; Neuro: Negative for headache, lightheadedness and neck stiffness. Negative for weakness, altered level of consciousness, altered mental status, extremity weakness, paresthesias, involuntary movement, seizure and syncope.; Psych:  +SI, SA. No HI, no hallucinations.       Physical Exam Updated Vital Signs BP 105/69   Pulse 100   Resp (!) 23   SpO2 96%    Patient Vitals for the past 24 hrs:  BP Pulse Resp SpO2  10/08/18 2100 105/69 100 (!) 23 96 %  10/08/18 2030 (!) 98/51 (!) 101 (!) 24 99 %  10/08/18 2004 106/63 (!) 105 17 98 %     Physical Exam 2005: Physical examination:  Nursing notes reviewed; Vital signs and O2 SAT reviewed;  Constitutional:  Well developed, Well nourished, Well hydrated, In no acute distress; Head:  Normocephalic, atraumatic; Eyes: EOMI, PERRL, No scleral icterus; ENMT: Mouth and pharynx normal, Mucous membranes moist; Neck: Supple, Full range of motion, No lymphadenopathy; Cardiovascular: Tachycardic rate and rhythm, No gallop; Respiratory: Breath sounds clear & equal bilaterally, No wheezes.  Speaking full sentences with ease, Normal respiratory effort/excursion; Chest: Nontender, Movement normal; Abdomen: Soft, Nontender, Nondistended, Normal bowel sounds; Genitourinary: No CVA tenderness; Extremities: Peripheral pulses normal, No tenderness, No edema, No calf edema or asymmetry.; Neuro: AA&Ox3, Major CN grossly intact.  Speech clear. No gross focal motor or sensory deficits in extremities.; Skin: Color normal, Warm, Dry.; Psych:  Easily agitated, endorses SI/SA.     ED Treatments / Results  Labs (all labs ordered are listed, but only abnormal results are displayed)   EKG EKG Interpretation  Date/Time:  Friday October 08 2018 20:03:50 EDT Ventricular Rate:  104 PR Interval:    QRS Duration: 76 QT Interval:  347 QTC Calculation: 457 R Axis:   72 Text Interpretation:  Sinus tachycardia Baseline wander No old tracing to compare Confirmed by Samuel Jester [phone]) on 10/08/2018 8:17:35 PM   Radiology   Procedures Procedures (including critical care time)  Medications Ordered in ED Medications  sodium chloride 0.9 % bolus 1,000 mL (1,000 mLs Intravenous New Bag/Given 10/08/18 2127)  0.9 %  sodium chloride infusion (has no administration in time range)  charcoal activated (NO SORBITOL) (ACTIDOSE-AQUA) suspension 50 g (50 g Oral Given 10/08/18 2044)     Initial Impression / Assessment and Plan / ED Course  I have reviewed the triage vital signs and the nursing notes.  Pertinent labs & imaging results that were available during my care of the patient were reviewed by me and considered in my medical  decision making (see chart for details).  MDM Reviewed: previous chart, nursing note and vitals Reviewed previous: labs Interpretation: labs and ECG    Results for orders placed or performed during the hospital encounter of 10/08/18  Acetaminophen level  Result Value Ref Range   Acetaminophen (Tylenol), Serum <10 (L) 10 - 30 ug/mL  Comprehensive metabolic panel  Result Value Ref Range   Sodium 142 135 - 145 mmol/L   Potassium 3.6 3.5 - 5.1 mmol/L   Chloride 114 (H) 98 - 111 mmol/L   CO2 23 22 - 32 mmol/L   Glucose, Bld 107 (H) 70 - 99 mg/dL   BUN 11 6 - 20 mg/dL   Creatinine, Ser 6.04 0.44 - 1.00 mg/dL   Calcium 8.4 (L) 8.9 - 10.3 mg/dL   Total Protein 6.2 (L)  6.5 - 8.1 g/dL   Albumin 3.5 3.5 - 5.0 g/dL   AST 16 15 - 41 U/L   ALT 14 0 - 44 U/L   Alkaline Phosphatase 45 38 - 126 U/L   Total Bilirubin 0.3 0.3 - 1.2 mg/dL   GFR calc non Af Amer >60 >60 mL/min   GFR calc Af Amer >60 >60 mL/min   Anion gap 5 5 - 15  Ethanol  Result Value Ref Range   Alcohol, Ethyl (B) <10 <10 mg/dL  Salicylate level  Result Value Ref Range   Salicylate Lvl <7.0 2.8 - 30.0 mg/dL  CBC with Differential  Result Value Ref Range   WBC 9.7 4.0 - 10.5 K/uL   RBC 3.75 (L) 3.87 - 5.11 MIL/uL   Hemoglobin 10.5 (L) 12.0 - 15.0 g/dL   HCT 40.9 (L) 81.1 - 91.4 %   MCV 90.4 80.0 - 100.0 fL   MCH 28.0 26.0 - 34.0 pg   MCHC 31.0 30.0 - 36.0 g/dL   RDW 78.2 95.6 - 21.3 %   Platelets 293 150 - 400 K/uL   nRBC 0.0 0.0 - 0.2 %   Neutrophils Relative % 68 %   Neutro Abs 6.6 1.7 - 7.7 K/uL   Lymphocytes Relative 23 %   Lymphs Abs 2.2 0.7 - 4.0 K/uL   Monocytes Relative 6 %   Monocytes Absolute 0.5 0.1 - 1.0 K/uL   Eosinophils Relative 2 %   Eosinophils Absolute 0.2 0.0 - 0.5 K/uL   Basophils Relative 1 %   Basophils Absolute 0.1 0.0 - 0.1 K/uL   Immature Granulocytes 0 %   Abs Immature Granulocytes 0.03 0.00 - 0.07 K/uL  Protime-INR  Result Value Ref Range   Prothrombin Time 12.4 11.4 - 15.2  seconds   INR 0.93   Urine rapid drug screen (hosp performed)  Result Value Ref Range   Opiates POSITIVE (A) NONE DETECTED   Cocaine NONE DETECTED NONE DETECTED   Benzodiazepines POSITIVE (A) NONE DETECTED   Amphetamines NONE DETECTED NONE DETECTED   Tetrahydrocannabinol POSITIVE (A) NONE DETECTED   Barbiturates NONE DETECTED NONE DETECTED  Pregnancy, urine  Result Value Ref Range   Preg Test, Ur NEGATIVE NEGATIVE  Urinalysis, Routine w reflex microscopic  Result Value Ref Range   Color, Urine STRAW (A) YELLOW   APPearance CLEAR CLEAR   Specific Gravity, Urine 1.010 1.005 - 1.030   pH 6.0 5.0 - 8.0   Glucose, UA NEGATIVE NEGATIVE mg/dL   Hgb urine dipstick NEGATIVE NEGATIVE   Bilirubin Urine NEGATIVE NEGATIVE   Ketones, ur NEGATIVE NEGATIVE mg/dL   Protein, ur NEGATIVE NEGATIVE mg/dL   Nitrite NEGATIVE NEGATIVE   Leukocytes, UA TRACE (A) NEGATIVE   RBC / HPF 0-5 0 - 5 RBC/hpf   WBC, UA 0-5 0 - 5 WBC/hpf   Bacteria, UA RARE (A) NONE SEEN   Squamous Epithelial / LPF 0-5 0 - 5    2140:  Poison Control contacted: recommends 24hr observation, cardiac monitoring, monitor for seizures x24 hours, dose activated charcoal now.  Pt vomited after drinking small amount of charcoal; will not re-dose. Pt placed under IVC, as she began stating she was going to leave to attempt further self harm.  [place] PMP Database accessed: multiple benzo prescriptions noted, no opiate prescriptions over the past 1 year. T/C returned from [facility]. Adrian Blackwater, case discussed, including:  HPI, pertinent PM/SHx, VS/PE, dx testing, ED course and treatment:  Agreeable to admit.  Final Clinical Impressions(s) / ED Diagnoses   Final diagnoses:  Intentional drug overdose, initial encounter [facility])  Suicidal ideation    ED Discharge Orders    None       Samuel Jester, DO 10/11/18 1610

## 2018-10-08 NOTE — H&P (Signed)
History and Physical  Brianna Kim ZHY:[id number] DOB: 1981/04/09 DOA: 10/08/2018  Referring physician: Dr Clarene Duke, ED physician PCP: Riley Churches, NP  Outpatient Specialists: none  Patient Coming From: home  Chief Complaint: overdose  HPI: Brianna Kim is a 37 y.o. female with a history of depression and anxiety, seizure disorder. Patient took approximately 20 tablets of Wellbutrin earlier this evening. Her husband found the empty bottle when he came home and called EMS. She has been struggling with depression over the past month, which has been worsening. Had previously been on lexapro and klonopin and had been stable. Her PCP changed a few months ago and her new PCP changed her medications to Wellbutrin and Atarax. After that, her depression began to worsen.   + Anhedonia, fatigue, difficulty sleeping  Emergency Department Course: Poison control contacted, recommendations given.   Review of Systems:   Pt denies any fevers, chills, nausea, vomiting, diarrhea, constipation, abdominal pain, shortness of breath, dyspnea on exertion, orthopnea, cough, wheezing, palpitations, headache, vision changes, lightheadedness, dizziness, melena, rectal bleeding.  Review of systems are otherwise negative  Past Medical History:  Diagnosis Date  . Seizures (HCC)    No past surgical history on file. Social History:  reports that she has quit smoking. She smoked 0.03 packs per day for 0.00 years. She has never used smokeless tobacco. She reports that she does not drink alcohol or use drugs. Patient lives at home  No Known Allergies  No family history on file.  Family history of depression  Prior to Admission medications   Medication Sig Start Date End Date Taking? Authorizing Provider  HYDROcodone-acetaminophen (NORCO/VICODIN) 5-325 MG tablet Take 1 tablet by mouth every 4 (four) hours as needed. 11/04/15   Raeford Razor, MD  meclizine (ANTIVERT) 25 MG tablet Take 1 tablet (25 mg total)  by mouth every 6 (six) hours as needed for dizziness. 12/18/13   Devoria Albe, MD  ondansetron (ZOFRAN) 4 MG tablet Take 1 tablet (4 mg total) by mouth every 8 (eight) hours as needed for nausea or vomiting. 12/18/13   Devoria Albe, MD  phenytoin (DILANTIN) 100 MG ER capsule Take 1 capsule (100 mg total) by mouth 3 (three) times daily. 07/13/13   Gilda Crease, MD  phenytoin (DILANTIN) 100 MG ER capsule Take 1 capsule (100 mg total) by mouth 3 (three) times daily. 12/18/13   Devoria Albe, MD    Physical Exam: BP 105/69   Pulse 91   Resp (!) 26   SpO2 98%   . General: Young female. Awake and alert and oriented x3. No acute cardiopulmonary distress.  Marland Kitchen HEENT: Normocephalic atraumatic.  Right and left ears normal in appearance.  Pupils equal, round, reactive to light. Extraocular muscles are intact. Sclerae anicteric and noninjected.  Moist mucosal membranes. No mucosal lesions.  . Neck: Neck supple without lymphadenopathy. No carotid bruits. No masses palpated.  . Cardiovascular: Regular rate with normal S1-S2 sounds. No murmurs, rubs, gallops auscultated. No JVD.  Marland Kitchen Respiratory: Good respiratory effort with no wheezes, rales, rhonchi. Lungs clear to auscultation bilaterally.  No accessory muscle use. . Abdomen: Soft, nontender, nondistended. Active bowel sounds. No masses or hepatosplenomegaly  . Skin: No rashes, lesions, or ulcerations.  Dry, warm to touch. 2+ dorsalis pedis and radial pulses. . Musculoskeletal: No calf or leg pain. All major joints not erythematous nontender.  No upper or lower joint deformation.  Good ROM.  No contractures  . Psychiatric: Intact judgment and insight. Pleasant and cooperative. Marland Kitchen  Neurologic: No focal neurological deficits. Strength is 5/5 and symmetric in upper and lower extremities.  Cranial nerves II through XII are grossly intact.           Labs on Admission: I have personally reviewed following labs and imaging studies  CBC: Recent Labs  Lab  10/08/18 2033  WBC 9.7  NEUTROABS 6.6  HGB 10.5*  HCT 33.9*  MCV 90.4  PLT 293   Basic Metabolic Panel: Recent Labs  Lab 10/08/18 2033  NA 142  K 3.6  CL 114*  CO2 23  GLUCOSE 107*  BUN 11  CREATININE 0.93  CALCIUM 8.4*   GFR: CrCl cannot be calculated (Unknown ideal weight.). Liver Function Tests: Recent Labs  Lab 10/08/18 2033  AST 16  ALT 14  ALKPHOS 45  BILITOT 0.3  PROT 6.2*  ALBUMIN 3.5   No results for input(s): LIPASE, AMYLASE in the last 168 hours. No results for input(s): AMMONIA in the last 168 hours. Coagulation Profile: Recent Labs  Lab 10/08/18 2033  INR 0.93   Cardiac Enzymes: No results for input(s): CKTOTAL, CKMB, CKMBINDEX, TROPONINI in the last 168 hours. BNP (last 3 results) No results for input(s): PROBNP in the last 8760 hours. HbA1C: No results for input(s): HGBA1C in the last 72 hours. CBG: No results for input(s): GLUCAP in the last 168 hours. Lipid Profile: No results for input(s): CHOL, HDL, LDLCALC, TRIG, CHOLHDL, LDLDIRECT in the last 72 hours. Thyroid Function Tests: No results for input(s): TSH, T4TOTAL, FREET4, T3FREE, THYROIDAB in the last 72 hours. Anemia Panel: No results for input(s): VITAMINB12, FOLATE, FERRITIN, TIBC, IRON, RETICCTPCT in the last 72 hours. Urine analysis:    Component Value Date/Time   COLORURINE STRAW (A) 10/08/2018 2009   APPEARANCEUR CLEAR 10/08/2018 2009   LABSPEC 1.010 10/08/2018 2009   PHURINE 6.0 10/08/2018 2009   GLUCOSEU NEGATIVE 10/08/2018 2009   HGBUR NEGATIVE 10/08/2018 2009   BILIRUBINUR NEGATIVE 10/08/2018 2009   KETONESUR NEGATIVE 10/08/2018 2009   PROTEINUR NEGATIVE 10/08/2018 2009   UROBILINOGEN 0.2 12/18/2013 1918   NITRITE NEGATIVE 10/08/2018 2009   LEUKOCYTESUR TRACE (A) 10/08/2018 2009   Sepsis Labs: @LABRCNTIP (procalcitonin:4,lacticidven:4) )No results found for this or any previous visit (from the past 240 hour(s)).   Radiological Exams on Admission: No results  found.  EKG: Independently reviewed. Sinus rhythm  Assessment/Plan: Principal Problem:   Drug overdose, intentional self-harm, initial encounter [facility]) Active Problems:   Seizure disorder [facility])   Depression    This patient was discussed with the ED physician, including pertinent vitals, physical exam findings, labs, and imaging.  We also discussed care given by the ED provider.  1. Drug Overdose a. Poison Control Recommendations:   i. 24 hour observation, cardiac monitoring, monitor for seizures x 24 hours, Treat Seizures with benzo's first, do not use phenytoin or keppra for seizures.   If Benzo's don't help, try phenobarb or propofol.    Give Activated charcoal without sorbitol 1 gm/kg (75 gm max dose)    If QTC normal can use zofran.   Obtain phenytoin level, ekg, other labs already ordered.  2. Depression/anxiety a. Ativan for now. 3. Seizure disorder a. Holding meds  DVT prophylaxis: scds Consultants: psych Code Status: full Family Communication: none  Disposition Plan: IVC   Levie Heritage, DO [facility] Pager [phone]  If 7PM-7AM, please contact night-coverage [email or web address] Password [email or web address]

## 2018-10-08 NOTE — ED Notes (Signed)
Dr Adrian Blackwater in with pt

## 2018-10-08 NOTE — ED Notes (Signed)
Poison Control Recommendations:   24 hour observation, cardiac monitoring, monitor for seizures x 24 hours, Treat Seizures with benzo's first, do not use phenytoin or keppra for seizures.   If Benzo's don't help, try phenobarb or propofol.    Give Activated charcoal without sorbitol 1 gm/kg (75 gm max dose)    If QTC normal can use zofran.   Obtain phenytoin level, ekg, other labs already ordered.

## 2018-10-09 DIAGNOSIS — G40909 Epilepsy, unspecified, not intractable, without status epilepticus: Secondary | ICD-10-CM | POA: Diagnosis not present

## 2018-10-09 DIAGNOSIS — F332 Major depressive disorder, recurrent severe without psychotic features: Secondary | ICD-10-CM

## 2018-10-09 DIAGNOSIS — T50902A Poisoning by unspecified drugs, medicaments and biological substances, intentional self-harm, initial encounter: Secondary | ICD-10-CM

## 2018-10-09 LAB — MRSA PCR SCREENING: MRSA by PCR: NEGATIVE

## 2018-10-09 LAB — COMPREHENSIVE METABOLIC PANEL
ALK PHOS: 37 U/L — AB (ref 38–126)
ALT: 12 U/L (ref 0–44)
ANION GAP: 6 (ref 5–15)
AST: 12 U/L — ABNORMAL LOW (ref 15–41)
Albumin: 3 g/dL — ABNORMAL LOW (ref 3.5–5.0)
BILIRUBIN TOTAL: 0.3 mg/dL (ref 0.3–1.2)
BUN: 9 mg/dL (ref 6–20)
CALCIUM: 7.5 mg/dL — AB (ref 8.9–10.3)
CO2: 19 mmol/L — ABNORMAL LOW (ref 22–32)
Chloride: 117 mmol/L — ABNORMAL HIGH (ref 98–111)
Creatinine, Ser: 0.77 mg/dL (ref 0.44–1.00)
GFR calc non Af Amer: >60 mL/min (ref >60)
Glucose, Bld: 97 mg/dL (ref 70–99)
Potassium: 3.7 mmol/L (ref 3.5–5.1)
Sodium: 142 mmol/L (ref 135–145)
TOTAL PROTEIN: 5.4 g/dL — AB (ref 6.5–8.1)

## 2018-10-09 LAB — TSH: TSH: 0.439 u[IU]/mL (ref 0.350–4.500)

## 2018-10-09 LAB — ACETAMINOPHEN LEVEL: Acetaminophen (Tylenol), Serum: <10 ug/mL — ABNORMAL LOW (ref 10–30)

## 2018-10-09 MED ORDER — LEVETIRACETAM 500 MG PO TABS
500.0000 mg | ORAL_TABLET | Freq: Two times a day (BID) | ORAL | Status: DC
  Administered 2018-10-09: 500 mg via ORAL
  Filled 2018-10-09: qty 1

## 2018-10-09 MED ORDER — LEVETIRACETAM 500 MG PO TABS: 500.0000 mg | ORAL_TABLET | Freq: Two times a day (BID) | ORAL | Status: DC

## 2018-10-09 MED ORDER — LORAZEPAM 2 MG/ML IJ SOLN
0.5000 mg | INTRAMUSCULAR | Status: DC | PRN
  Administered 2018-10-09: 1 mg via INTRAVENOUS
  Filled 2018-10-09: qty 1

## 2018-10-09 MED ORDER — NICOTINE 7 MG/24HR TD PT24
7.0000 mg | MEDICATED_PATCH | Freq: Every day | TRANSDERMAL | Status: DC
  Administered 2018-10-09 (×2): 7 mg via TRANSDERMAL
  Filled 2018-10-09 (×2): qty 1

## 2018-10-09 MED ORDER — SUMATRIPTAN SUCCINATE 25 MG PO TABS: 25.0000 mg | ORAL_TABLET | Freq: Every day | ORAL | 0 refills | Status: DC | PRN
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
## 2018-10-09 NOTE — Progress Notes (Signed)
10/09/2018  We have been monitoring the patient closely in the stepdown unit ICU throughout the day and she is remained stable.  She has had no seizure activity.  She has been eating and drinking well.  She is voiding well.  Given these findings and her stable labs and EKG and telemetry I think that she is medically stable to transfer to inpatient psychiatric hospital for further treatment.  We will discharge the patient from acute medical care to behavioral hospital for psychiatric treatment.  Restart home Keppra.  Did not restart home medications for depression at this time as she will be assessed by the psychiatric specialists.   Maryln Manuel MD

## 2018-10-09 NOTE — Progress Notes (Signed)
PROGRESS NOTE    Brianna Kim  QVZ:[id number]  DOB: 05/07/1981  DOA: 10/08/2018 PCP: Riley Churches, NP   Brief Admission Hx: 37 y/o female with depression who was admitted with intentional overdose of wellbutrin.    MDM/Assessment & Plan:   1. Intentional drug overdose - wellbutrin - appreciate recommendations from poison control center, will continue to monitor patient thru 10 pm for seizure activity and would treat only with benzodiazepine if it occurs.  So far, there has been no seizure activity.  Pt remains closely monitored with sitter at bedside in stepdown ICU.  Continue supportive care with IV Fluids. Continue telemetry monitoring.  TTS evaluated patient and when medically cleared is accepted to behavioral health.   2. Depression / anxiety - Pt will need inpatient psychiatric treatment and stabilization.   3. Seizure disorder - currently holding home keppra, would only use benzos as first line to treat seizures per poison control recommendations.    DVT prophylaxis: SCDs Code Status: Full  Family Communication:  Patient updated at bedside rounds  Disposition Plan: Inpatient psychiatric treatment   Consultants:  Behavioral health  Subjective: Pt says she does not feel the greatest but is tired and wants to rest further. No specific complaints.    Objective: Vitals:   10/09/18 0011 10/09/18 0100 10/09/18 0330 10/09/18 0500  BP:  96/64    Pulse:  91    Resp:  (!) 27    Temp: 98.2 F (36.8 C)  98.2 F (36.8 C)   TempSrc: Oral  Oral   SpO2:  97%    Weight:    48.6 kg  Height:        Intake/Output Summary (Last 24 hours) at 10/09/2018 0616 Last data filed at 10/08/2018 2327 Gross per 24 hour  Intake 1200 ml  Output -  Net 1200 ml   Filed Weights   10/08/18 2250 10/09/18 0500  Weight: 46.7 kg 48.6 kg     REVIEW OF SYSTEMS  As per history otherwise all reviewed and reported negative  Exam:  General exam: awake, sleepy but easily arousable, no  distress. Flat affect.  Respiratory system: Clear. No increased work of breathing. Cardiovascular system: S1 & S2 heard, RRR. No JVD, murmurs, gallops, clicks or pedal edema. Gastrointestinal system: Abdomen is nondistended, soft and nontender. Normal bowel sounds heard. Central nervous system: Alert and oriented. No focal neurological deficits. Extremities: no CCE.  Data Reviewed: Basic Metabolic Panel: Recent Labs  Lab 10/08/18 2033 10/09/18 0259  NA 142 142  K 3.6 3.7  CL 114* 117*  CO2 23 19*  GLUCOSE 107* 97  BUN 11 9  CREATININE 0.93 0.77  CALCIUM 8.4* 7.5*   Liver Function Tests: Recent Labs  Lab 10/08/18 2033 10/09/18 0259  AST 16 12*  ALT 14 12  ALKPHOS 45 37*  BILITOT 0.3 0.3  PROT 6.2* 5.4*  ALBUMIN 3.5 3.0*   No results for input(s): LIPASE, AMYLASE in the last 168 hours. No results for input(s): AMMONIA in the last 168 hours. CBC: Recent Labs  Lab 10/08/18 2033  WBC 9.7  NEUTROABS 6.6  HGB 10.5*  HCT 33.9*  MCV 90.4  PLT 293   Cardiac Enzymes: No results for input(s): CKTOTAL, CKMB, CKMBINDEX, TROPONINI in the last 168 hours. CBG (last 3)  No results for input(s): GLUCAP in the last 72 hours. Recent Results (from the past 240 hour(s))  MRSA PCR Screening     Status: None   Collection Time: 10/08/18 10:50 PM  Result Value Ref Range Status   MRSA by PCR NEGATIVE NEGATIVE Final    Comment:        The GeneXpert MRSA Assay (FDA approved for NASAL specimens only), is one component of a comprehensive MRSA colonization surveillance program. It is not intended to diagnose MRSA infection nor to guide or monitor treatment for MRSA infections. Performed at [facility], [address], [place], [place] [address]      Studies: No results found.   Scheduled Meds: . nicotine  7 mg Transdermal Daily   Continuous Infusions: . sodium chloride 125 mL/hr at 10/08/18 2327    Principal Problem:   Drug overdose, intentional self-harm, initial  encounter [facility]) Active Problems:   Seizure disorder [facility])   Depression  Critical Care Time spent: 32 mins  Standley Dakins, MD, FAAFP [facility] Pager [phone]  If 7PM-7AM, please contact night-coverage [email or web address] Password [email or web address] 10/09/2018, 6:16 AM    LOS: 0 days

## 2018-10-09 NOTE — Progress Notes (Signed)
Pending medical clearance, pt is accepted to [facility] per Berneice Heinrich [facility]   Augustino Savastano S. Alan Ripper, MSW, LCSW Clinical Social Worker 10/09/2018 9:55 AM

## 2018-10-09 NOTE — Discharge Summary (Signed)
Physician Discharge Summary  GREENLY RARICK ZOX:[id number] DOB: 05-27-1981 DOA: 10/08/2018  PCP: Riley Churches, NP  Admit date: 10/08/2018 Discharge date: 10/09/2018  Admitted From: Home  Disposition: [facility]   Discharge Condition: STABLE   CODE STATUS: FULL    Brief Hospitalization Summary: Please see all hospital notes, images, labs for full details of the hospitalization. HPI from Dr. Adrian Blackwater: Brianna Kim is a 37 y.o. female with a history of depression and anxiety, seizure disorder. Patient took approximately 20 tablets of Wellbutrin earlier this evening. Her husband found the empty bottle when he came home and called EMS. She has been struggling with depression over the past month, which has been worsening. Had previously been on lexapro and klonopin and had been stable. Her PCP changed a few months ago and her new PCP changed her medications to Wellbutrin and Atarax. After that, her depression began to worsen. + Anhedonia, fatigue, difficulty sleeping.    Emergency Department Course: Poison control contacted, recommendations given.   Brief Admission Hx: 37 y/o female with depression who was admitted with intentional overdose of wellbutrin.    MDM/Assessment & Plan:   1. Intentional drug overdose - wellbutrin - appreciate recommendations from poison control center.  We have kept patient and monitored her in the stepdown unit under observation as recommended by the [facility]  The patient has remained stable with no seizure activity.  The patient has been eating and drinking well.  The patient is mentating well.  The patient has been evaluated by the TTS counseling service and they have reported to me that the patient does require inpatient behavioral health treatment.  The patient is medically stable at this time to transfer to behavioral health hospital for further behavioral health treatments when a bed is available. 2. Depression / anxiety - Pt  will need inpatient psychiatric treatment and stabilization.   3. Seizure disorder -resuming home Keppra for seizure control management.    DVT prophylaxis: SCDs Code Status: Full   Family Communication:  Patient updated at bedside rounds  Disposition Plan: Inpatient psychiatric treatment  Consultants:  Behavioral health  Discharge Diagnoses:  Principal Problem:   Drug overdose, intentional self-harm, initial encounter [facility]) Active Problems:   Seizure disorder [facility])   Depression  Discharge Instructions:  Allergies as of 10/09/2018   No Known Allergies     Medication List    STOP taking these medications   DULoxetine 60 MG capsule Commonly known as:  CYMBALTA   WELLBUTRIN PO     TAKE these medications   levETIRAcetam 500 MG tablet Commonly known as:  KEPPRA Take 500 mg by mouth 2 (two) times daily.   SUMAtriptan 25 MG tablet Commonly known as:  IMITREX Take 1 tablet (25 mg total) by mouth daily as needed for migraine. What changed:    when to take this  reasons to take this       No Known Allergies Allergies as of 10/09/2018   No Known Allergies     Medication List    STOP taking these medications   DULoxetine 60 MG capsule Commonly known as:  CYMBALTA   WELLBUTRIN PO     TAKE these medications   levETIRAcetam 500 MG tablet Commonly known as:  KEPPRA Take 500 mg by mouth 2 (two) times daily.   SUMAtriptan 25 MG tablet Commonly known as:  IMITREX Take 1 tablet (25 mg total) by mouth daily as needed for migraine. What changed:    when to  take this  reasons to take this       Procedures/Studies:  No results found.   Subjective: The patient is without complaints.  The patient was initially very tired but has become much more awake and has been eating and drinking well.  The patient has not had any seizure activity.  The patient has been cooperating with staff at this time.  Discharge Exam: Vitals:   10/09/18 1600 10/09/18 1616   BP: (!) 129/96   Pulse: (!) 53 (!) 50  Resp: (!) 25 18  Temp:  98.4 F (36.9 C)  SpO2: 99% 100%   Vitals:   10/09/18 1400 10/09/18 1500 10/09/18 1600 10/09/18 1616  BP: 114/78 (!) 175/127 (!) 129/96   Pulse: 85 71 (!) 53 (!) 50  Resp: (!) 25  (!) 25 18  Temp:    98.4 F (36.9 C)  TempSrc:    Oral  SpO2: 97% 97% 99% 100%  Weight:      Height:       General: Pt is alert, awake, not in acute distress Cardiovascular: RRR, S1/S2 +, no rubs, no gallops Respiratory: CTA bilaterally, no wheezing, no rhonchi Abdominal: Soft, NT, ND, bowel sounds + Extremities: no edema, no cyanosis   The results of significant diagnostics from this hospitalization (including imaging, microbiology, ancillary and laboratory) are listed below for reference.     Microbiology: Recent Results (from the past 240 hour(s))  MRSA PCR Screening     Status: None   Collection Time: 10/08/18 10:50 PM  Result Value Ref Range Status   MRSA by PCR NEGATIVE NEGATIVE Final    Comment:        The GeneXpert MRSA Assay (FDA approved for NASAL specimens only), is one component of a comprehensive MRSA colonization surveillance program. It is not intended to diagnose MRSA infection nor to guide or monitor treatment for MRSA infections. Performed at [facility], [address], [place], [place] [address]      Labs: BNP (last 3 results) No results for input(s): BNP in the last 8760 hours. Basic Metabolic Panel: Recent Labs  Lab 10/08/18 2033 10/09/18 0259  NA 142 142  K 3.6 3.7  CL 114* 117*  CO2 23 19*  GLUCOSE 107* 97  BUN 11 9  CREATININE 0.93 0.77  CALCIUM 8.4* 7.5*   Liver Function Tests: Recent Labs  Lab 10/08/18 2033 10/09/18 0259  AST 16 12*  ALT 14 12  ALKPHOS 45 37*  BILITOT 0.3 0.3  PROT 6.2* 5.4*  ALBUMIN 3.5 3.0*   No results for input(s): LIPASE, AMYLASE in the last 168 hours. No results for input(s): AMMONIA in the last 168 hours. CBC: Recent Labs  Lab 10/08/18 2033   WBC 9.7  NEUTROABS 6.6  HGB 10.5*  HCT 33.9*  MCV 90.4  PLT 293   Cardiac Enzymes: No results for input(s): CKTOTAL, CKMB, CKMBINDEX, TROPONINI in the last 168 hours. BNP: Invalid input(s): POCBNP CBG: No results for input(s): GLUCAP in the last 168 hours. D-Dimer No results for input(s): DDIMER in the last 72 hours. Hgb A1c No results for input(s): HGBA1C in the last 72 hours. Lipid Profile No results for input(s): CHOL, HDL, LDLCALC, TRIG, CHOLHDL, LDLDIRECT in the last 72 hours. Thyroid function studies Recent Labs    10/09/18 0331  TSH 0.439   Anemia work up No results for input(s): VITAMINB12, FOLATE, FERRITIN, TIBC, IRON, RETICCTPCT in the last 72 hours. Urinalysis    Component Value Date/Time   COLORURINE STRAW (  A) 10/08/2018 2009   APPEARANCEUR CLEAR 10/08/2018 2009   LABSPEC 1.010 10/08/2018 2009   PHURINE 6.0 10/08/2018 2009   GLUCOSEU NEGATIVE 10/08/2018 2009   HGBUR NEGATIVE 10/08/2018 2009   BILIRUBINUR NEGATIVE 10/08/2018 2009   KETONESUR NEGATIVE 10/08/2018 2009   PROTEINUR NEGATIVE 10/08/2018 2009   UROBILINOGEN 0.2 12/18/2013 1918   NITRITE NEGATIVE 10/08/2018 2009   LEUKOCYTESUR TRACE (A) 10/08/2018 2009   Sepsis Labs Invalid input(s): PROCALCITONIN,  WBC,  LACTICIDVEN Microbiology Recent Results (from the past 240 hour(s))  MRSA PCR Screening     Status: None   Collection Time: 10/08/18 10:50 PM  Result Value Ref Range Status   MRSA by PCR NEGATIVE NEGATIVE Final    Comment:        The GeneXpert MRSA Assay (FDA approved for NASAL specimens only), is one component of a comprehensive MRSA colonization surveillance program. It is not intended to diagnose MRSA infection nor to guide or monitor treatment for MRSA infections. Performed at [facility], [address], [place], [place] [address]    Time coordinating discharge:   SIGNED:  Standley Dakins, MD  [facility] 10/09/2018, 5:28 PM Pager [phone]  If 7PM-7AM,  please contact night-coverage [email or web address] Password [email or web address]

## 2018-10-09 NOTE — Progress Notes (Signed)
Patient and all personal belongings turned over to [facility] for transport to [facility]

## 2018-10-09 NOTE — Progress Notes (Signed)
Patient removed nicotine patch and threw it in the floor.  Patch disposed of.

## 2018-10-10 ENCOUNTER — Inpatient Hospital Stay (HOSPITAL_COMMUNITY)
Admission: AD | Admit: 2018-10-10 | Discharge: 2018-10-15 | DRG: 885 | Disposition: A | Discharge status: Home / Self Care | Payer: Medicaid Other | Source: Intra-hospital | Attending: Psychiatry | Admitting: Psychiatry

## 2018-10-10 ENCOUNTER — Encounter (HOSPITAL_COMMUNITY): Payer: Self-pay

## 2018-10-10 ENCOUNTER — Other Ambulatory Visit: Payer: Self-pay

## 2018-10-10 DIAGNOSIS — F410 Panic disorder [episodic paroxysmal anxiety] without agoraphobia: Secondary | ICD-10-CM | POA: Diagnosis present

## 2018-10-10 DIAGNOSIS — G4700 Insomnia, unspecified: Secondary | ICD-10-CM | POA: Diagnosis present

## 2018-10-10 DIAGNOSIS — F1210 Cannabis abuse, uncomplicated: Secondary | ICD-10-CM | POA: Diagnosis present

## 2018-10-10 DIAGNOSIS — Z87891 Personal history of nicotine dependence: Secondary | ICD-10-CM

## 2018-10-10 DIAGNOSIS — Z915 Personal history of self-harm: Secondary | ICD-10-CM | POA: Diagnosis not present

## 2018-10-10 DIAGNOSIS — Z79899 Other long term (current) drug therapy: Secondary | ICD-10-CM | POA: Diagnosis not present

## 2018-10-10 DIAGNOSIS — T424X2A Poisoning by benzodiazepines, intentional self-harm, initial encounter: Secondary | ICD-10-CM | POA: Diagnosis not present

## 2018-10-10 DIAGNOSIS — F332 Major depressive disorder, recurrent severe without psychotic features: Secondary | ICD-10-CM | POA: Diagnosis present

## 2018-10-10 DIAGNOSIS — F411 Generalized anxiety disorder: Secondary | ICD-10-CM | POA: Diagnosis present

## 2018-10-10 DIAGNOSIS — G40909 Epilepsy, unspecified, not intractable, without status epilepticus: Secondary | ICD-10-CM | POA: Diagnosis present

## 2018-10-10 DIAGNOSIS — T1491XA Suicide attempt, initial encounter: Secondary | ICD-10-CM

## 2018-10-10 DIAGNOSIS — R45851 Suicidal ideations: Secondary | ICD-10-CM | POA: Diagnosis present

## 2018-10-10 DIAGNOSIS — T43292A Poisoning by other antidepressants, intentional self-harm, initial encounter: Secondary | ICD-10-CM | POA: Diagnosis not present

## 2018-10-10 DIAGNOSIS — F419 Anxiety disorder, unspecified: Secondary | ICD-10-CM | POA: Diagnosis not present

## 2018-10-10 LAB — HIV ANTIBODY (ROUTINE TESTING W REFLEX): HIV SCREEN 4TH GENERATION: NONREACTIVE

## 2018-10-10 MED ORDER — NAPROXEN 500 MG PO TABS
500.0000 mg | ORAL_TABLET | Freq: Two times a day (BID) | ORAL | Status: AC | PRN
  Administered 2018-10-10 – 2018-10-14 (×10): 500 mg via ORAL
  Filled 2018-10-10 (×10): qty 1

## 2018-10-10 MED ORDER — LEVETIRACETAM 500 MG PO TABS
500.0000 mg | ORAL_TABLET | Freq: Two times a day (BID) | ORAL | Status: DC
  Administered 2018-10-10 – 2018-10-15 (×11): 500 mg via ORAL
  Filled 2018-10-10 (×16): qty 1

## 2018-10-10 MED ORDER — CLONIDINE HCL 0.1 MG PO TABS: 0.1000 mg | ORAL_TABLET | Freq: Every day | ORAL | Status: DC

## 2018-10-10 MED ORDER — ALUM & MAG HYDROXIDE-SIMETH 200-200-20 MG/5ML PO SUSP
30.0000 mL | ORAL | Status: DC | PRN
  Administered 2018-10-10: 30 mL via ORAL
  Filled 2018-10-10: qty 30

## 2018-10-10 MED ORDER — CHLORDIAZEPOXIDE HCL 25 MG PO CAPS: 25.0000 mg | ORAL_CAPSULE | Freq: Four times a day (QID) | ORAL | Status: DC | PRN

## 2018-10-10 MED ORDER — LOPERAMIDE HCL 2 MG PO CAPS: 2.0000 mg | ORAL_CAPSULE | ORAL | Status: AC | PRN

## 2018-10-10 MED ORDER — GABAPENTIN 100 MG PO CAPS
100.0000 mg | ORAL_CAPSULE | Freq: Three times a day (TID) | ORAL | Status: DC
  Administered 2018-10-10 – 2018-10-11 (×5): 100 mg via ORAL
  Filled 2018-10-10 (×11): qty 1

## 2018-10-10 MED ORDER — TRAZODONE HCL 50 MG PO TABS
50.0000 mg | ORAL_TABLET | Freq: Every evening | ORAL | Status: DC | PRN
  Administered 2018-10-10 – 2018-10-11 (×2): 50 mg via ORAL
  Filled 2018-10-10 (×10): qty 1

## 2018-10-10 MED ORDER — DICYCLOMINE HCL 20 MG PO TABS
20.0000 mg | ORAL_TABLET | Freq: Four times a day (QID) | ORAL | Status: AC | PRN
  Administered 2018-10-10 – 2018-10-14 (×8): 20 mg via ORAL
  Filled 2018-10-10 (×8): qty 1

## 2018-10-10 MED ORDER — CLONIDINE HCL 0.1 MG PO TABS
0.1000 mg | ORAL_TABLET | Freq: Four times a day (QID) | ORAL | Status: DC
  Administered 2018-10-10 – 2018-10-11 (×6): 0.1 mg via ORAL
  Filled 2018-10-10 (×11): qty 1

## 2018-10-10 MED ORDER — HYDROXYZINE HCL 25 MG PO TABS
25.0000 mg | ORAL_TABLET | Freq: Four times a day (QID) | ORAL | Status: DC | PRN
  Administered 2018-10-10 – 2018-10-13 (×8): 25 mg via ORAL
  Filled 2018-10-10 (×8): qty 1

## 2018-10-10 MED ORDER — MAGNESIUM HYDROXIDE 400 MG/5ML PO SUSP: 30.0000 mL | Freq: Every day | ORAL | Status: DC | PRN

## 2018-10-10 MED ORDER — NICOTINE 21 MG/24HR TD PT24
21.0000 mg | MEDICATED_PATCH | Freq: Every day | TRANSDERMAL | Status: DC
  Administered 2018-10-10 – 2018-10-15 (×6): 21 mg via TRANSDERMAL
  Filled 2018-10-10 (×8): qty 1

## 2018-10-10 MED ORDER — ESCITALOPRAM OXALATE 10 MG PO TABS
10.0000 mg | ORAL_TABLET | Freq: Every day | ORAL | Status: DC
  Administered 2018-10-10 – 2018-10-15 (×6): 10 mg via ORAL
  Filled 2018-10-10 (×9): qty 1

## 2018-10-10 MED ORDER — METHOCARBAMOL 500 MG PO TABS
500.0000 mg | ORAL_TABLET | Freq: Three times a day (TID) | ORAL | Status: AC | PRN
  Administered 2018-10-11 – 2018-10-14 (×5): 500 mg via ORAL
  Filled 2018-10-10 (×5): qty 1

## 2018-10-10 MED ORDER — CLONIDINE HCL 0.1 MG PO TABS
0.1000 mg | ORAL_TABLET | ORAL | Status: DC
  Filled 2018-10-10 (×2): qty 1

## 2018-10-10 NOTE — [facility] Suicide Risk Assessment (Signed)
[facility] Admission Suicide Risk Assessment   Nursing information obtained from:  Patient Demographic factors:  Caucasian, Low socioeconomic status Current Mental Status:  Self-harm behaviors Loss Factors:  Decrease in vocational status, Financial problems / change in socioeconomic status Historical Factors:  Prior suicide attempts Risk Reduction Factors:  Responsible for children under 36 years of age, Living with another person, especially a relative  Total Time spent with patient: 30 minutes Principal Problem: <principal problem not specified> Diagnosis:   Patient Active Problem List   Diagnosis Date Noted  . MDD (major depressive disorder), recurrent episode, severe (HCC) [F33.2] 10/10/2018  . Drug overdose, intentional self-harm, initial encounter (HCC) [T50.902A] 10/08/2018  . Seizure disorder (HCC) [G40.909] 10/08/2018  . Depression [F32.9] 10/08/2018   Subjective Data: Patient is seen and examined.  Patient is a 37 year old female with a reported past psychiatric history significant for major depression and generalized anxiety disorder who presented to the [facility] emergency department on 10/08/2018 after an intentional overdose of Wellbutrin.  The patient stated that she felt as though her life was falling apart.  She stated that over the last 2 weeks she had dressed only in sweatpants and slept a lot.  She admitted to helplessness, hopelessness and worthlessness.  She had no motivation.  She stated that she had been being treated by a nurse practitioner near her home.  She stated that she had been on several antidepressants including Paxil and Zoloft.  She stated that neither of these medications worked.  She stated she is also been using Xanax.  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  Her previous nurse practitioner had decided to stop  her Xanax.  She apparently had "seizures" after that.  She recently saw a new nurse practitioner who had restarted the Xanax.  The [facility] database shows her last prescription for Xanax was filled on 08/17/2018.  She was given 0.5 mg and 60 tablets.  She stated that her marriage was not going well, and she had conflicts there ongoing with her mother.  She stated this is what led to much of her depression.  She stated that she had been previously treated with Lexapro earlier this year, and had done well with it.  She was unclear on why they had switched her to Wellbutrin.  He had also wanted to place her on Cymbalta.  Her drug screen on admission showed the presence of benzodiazepines, opiates and marijuana.  The patient reported that she is only used marijuana times once in the last month.  She also stated that she had gotten Valium as well as a Norco tablet from her spouse's medication.  She was admitted to the hospital for evaluation and stabilization.  Continued Clinical Symptoms:  Alcohol Use Disorder Identification Test Final Score (AUDIT): 0 The "Alcohol Use Disorders Identification Test", Guidelines for Use in Primary Care, Second Edition.  World Science writer [facility]). Score between 0-7:  no or low risk or alcohol related problems. Score between 8-15:  moderate risk of alcohol related problems. Score between 16-19:  high risk of alcohol related problems. Score 20 or above:  warrants further diagnostic evaluation for alcohol dependence and treatment.   CLINICAL FACTORS:   Severe Anxiety and/or Agitation Depression:   Aggression Anhedonia Hopelessness Impulsivity Insomnia Alcohol/Substance Abuse/Dependencies   Musculoskeletal: Strength & Muscle Tone: within normal limits  Gait & Station: normal Patient leans: N/A  Psychiatric Specialty Exam: Physical Exam  Nursing note and vitals reviewed. Constitutional: She is oriented to person, place, and time. She appears well-developed  and well-nourished.  HENT:  Head: Normocephalic and atraumatic.  Respiratory: Effort normal.  Neurological: She is alert and oriented to person, place, and time.    ROS  Blood pressure 124/70, pulse 61, temperature 98 F (36.7 C), temperature source Oral, resp. rate 16, height 5\' 3"  (1.6 m), weight 46.7 kg.Body mass index is 18.25 kg/m.  General Appearance: Disheveled  Eye Contact:  Fair  Speech:  Normal Rate  Volume:  Increased  Mood:  Anxious, Depressed, Dysphoric and Irritable  Affect:  Congruent  Thought Process:  Coherent and Descriptions of Associations: Circumstantial  Orientation:  Full (Time, Place, and Person)  Thought Content:  Logical  Suicidal Thoughts:  No  Homicidal Thoughts:  No  Memory:  Immediate;   Fair Recent;   Fair Remote;   Fair  Judgement:  Intact  Insight:  Lacking  Psychomotor Activity:  Increased  Concentration:  Concentration: Fair and Attention Span: Fair  Recall:  [employer] of Knowledge:  Fair  Language:  Fair  Akathisia:  Negative  Handed:  Right  AIMS (if indicated):     Assets:  Communication Skills Desire for Improvement Housing Physical Health Resilience  ADL's:  Intact  Cognition:  WNL  Sleep:  Number of Hours: 2      COGNITIVE FEATURES THAT CONTRIBUTE TO RISK:  Closed-mindedness    SUICIDE RISK:   Minimal: No identifiable suicidal ideation.  Patients presenting with no risk factors but with morbid ruminations; may be classified as minimal risk based on the severity of the depressive symptoms  PLAN OF CARE: Patient is seen and examined.  Patient is a 37 year old female with the above-stated past psychiatric history who was admitted after an intentional overdose of Wellbutrin.  She will be admitted to the hospital.  She will be integrated into the milieu.  She will be encouraged to attend groups.  She will be placed on Librium 25 mg p.o. every 6 hours as needed CIWA greater than 10.  She will also be restarted on her Lexapro 10  mg p.o. daily.  She will also have hydroxyzine for anxiety, and I have also added gabapentin 100 mg p.o. 3 times daily and we will titrate this.  She has a reported history of seizure disorder.  Apparently when she ran out of Xanax previously she had a seizure.  They had placed her on Keppra.  She admitted that she had seizures from withdrawal in the past.  I have restarted her Keppra 500 mg p.o. twice daily, and started seizure precautions.  We will collect collateral information from her husband, and hopefully be able to get her stabilized.  I certify that inpatient services furnished can reasonably be expected to improve the patient's condition.   Antonieta Pert, MD 10/10/2018, 8:38 AM

## 2018-10-10 NOTE — BHH Counselor (Signed)
Clinical Social Work Note  Psychosocial Assessment attempted with patient, who was sleeping heavily and snoring.  To be followed by CSW team tomorrow.  Ambrose Mantle, LCSW 10/10/2018, 3:55 PM

## 2018-10-10 NOTE — H&P (Signed)
Psychiatric Admission Assessment Adult  Patient Identification: Brianna Kim MRN:  [id number] Date of Evaluation:  10/10/2018 Chief Complaint:  MDD Principal Diagnosis: <principal problem not specified> Diagnosis:   Patient Active Problem List   Diagnosis Date Noted  . MDD (major depressive disorder), recurrent episode, severe ([place]) [F33.2] 10/10/2018  . Drug overdose, intentional self-harm, initial encounter ([place]) [T50.902A] 10/08/2018  . Seizure disorder ([place]) [O03.559] 10/08/2018  . Depression [F32.9] 10/08/2018   History of Present Illness: Patient is seen and examined.  Patient is a 37 year old female with a reported past psychiatric history significant for major depression and generalized anxiety disorder who presented to the [facility] emergency department on 10/08/2018 after an intentional overdose of Wellbutrin.  The patient stated that she felt as though her life was falling apart.  She stated that over the last 2 weeks she had dressed only in sweatpants and slept a lot.  She admitted to helplessness, hopelessness and worthlessness.  She had no motivation.  She stated that she had been being treated by a nurse practitioner near her home.  She stated that she had been on several antidepressants including Paxil and Zoloft.  She stated that neither of these medications worked.  She stated she is also been using Xanax.  The patient during the interview admitted that she had been abusing Xanax since she was aged 30, and there is a notation in the chart from her husband who reported she was overusing it as well.  Her previous nurse practitioner had decided to stop her Xanax.  She apparently had "seizures" after that.  She recently saw a new nurse practitioner who had restarted the Xanax.  The [place] shows her last prescription for Xanax was filled on 08/17/2018.  She was given 0.5 mg and 60 tablets.  She stated that her marriage was not going well, and she had conflicts there  ongoing with her mother.  She stated this is what led to much of her depression.  She stated that she had been previously treated with Lexapro earlier this year, and had done well with it.  She was unclear on why they had switched her to Wellbutrin.  He had also wanted to place her on Cymbalta.  Her drug screen on admission showed the presence of benzodiazepines, opiates and marijuana.  The patient reported that she is only used marijuana times once in the last month.  She also stated that she had gotten Valium as well as a Norco tablet from her spouse's medication.  She was admitted to the hospital for evaluation and stabilization.  Associated Signs/Symptoms: Depression Symptoms:  depressed mood, anhedonia, insomnia, psychomotor agitation, fatigue, feelings of worthlessness/guilt, difficulty concentrating, hopelessness, suicidal thoughts without plan, suicidal attempt, anxiety, panic attacks, loss of energy/fatigue, disturbed sleep, (Hypo) Manic Symptoms:  Impulsivity, Irritable Mood, Labiality of Mood, Anxiety Symptoms:  Excessive Worry, Psychotic Symptoms:  Denied PTSD Symptoms: Negative Total Time spent with patient: 30 minutes  Past Psychiatric History: One previous suicide attempt as an adolescent.  No previous psychiatric admissions.  She is been followed as an outpatient.  Is the patient at risk to self? Yes.    Has the patient been a risk to self in the past 6 months? Yes.    Has the patient been a risk to self within the distant past? Yes.    Is the patient a risk to others? No.  Has the patient been a risk to others in the past 6 months? No.  Has the patient  been a risk to others within the distant past? No.   Prior Inpatient Therapy:   Prior Outpatient Therapy:    Alcohol Screening: 1. How often do you have a drink containing alcohol?: Never 2. How many drinks containing alcohol do you have on a typical day when you are drinking?: 1 or 2 3. How often do you have  six or more drinks on one occasion?: Never AUDIT-C Score: 0 9. Have you or someone else been injured as a result of your drinking?: No 10. Has a relative or friend or a doctor or another health worker been concerned about your drinking or suggested you cut down?: No Alcohol Use Disorder Identification Test Final Score (AUDIT): 0 Intervention/Follow-up: AUDIT Score <7 follow-up not indicated Substance Abuse History in the last 12 months:  Yes.   Consequences of Substance Abuse: Family Consequences:  Marriage issues, conflict with mother Previous Psychotropic Medications: Yes  Psychological Evaluations: Yes  Past Medical History:  Past Medical History:  Diagnosis Date  . Seizures ([place])    History reviewed. No pertinent surgical history. Family History: History reviewed. No pertinent family history. Family Psychiatric  History: Mother reportedly has psychiatric issues Tobacco Screening: Have you used any form of tobacco in the last 30 days? (Cigarettes, Smokeless Tobacco, Cigars, and/or Pipes): Yes Tobacco use, Select all that apply: 5 or more cigarettes per day Are you interested in Tobacco Cessation Medications?: Yes, will notify MD for an order Counseled patient on smoking cessation including recognizing danger situations, developing coping skills and basic information about quitting provided: Refused/Declined practical counseling Social History:  Social History   Substance and Sexual Activity  Alcohol Use No     Social History   Substance and Sexual Activity  Drug Use No    Additional Social History:                           Allergies:  No Known Allergies Lab Results:  Results for orders placed or performed during the hospital encounter of 10/08/18 (from the past 48 hour(s))  Urine rapid drug screen (hosp performed)     Status: Abnormal   Collection Time: 10/08/18  8:09 PM  Result Value Ref Range   Opiates POSITIVE (A) NONE DETECTED   Cocaine NONE DETECTED NONE  DETECTED   Benzodiazepines POSITIVE (A) NONE DETECTED   Amphetamines NONE DETECTED NONE DETECTED   Tetrahydrocannabinol POSITIVE (A) NONE DETECTED   Barbiturates NONE DETECTED NONE DETECTED    Comment: (NOTE) DRUG SCREEN FOR MEDICAL PURPOSES ONLY.  IF CONFIRMATION IS NEEDED FOR ANY PURPOSE, NOTIFY LAB WITHIN 5 DAYS. LOWEST DETECTABLE LIMITS FOR URINE DRUG SCREEN Drug Class                     Cutoff (ng/mL) Amphetamine and metabolites    1000 Barbiturate and metabolites    200 Benzodiazepine                 466 Tricyclics and metabolites     300 Opiates and metabolites        300 Cocaine and metabolites        300 THC                            50 Performed at [facility], [address] [place], [place] [address]   Pregnancy, urine     Status: None   Collection Time: 10/08/18  8:09 PM  Result Value Ref Range   Preg Test, Ur NEGATIVE NEGATIVE    Comment:        THE SENSITIVITY OF THIS METHODOLOGY IS >20 mIU/mL. Performed at [facility], [address], [place], [place] [address]   Urinalysis, Routine w reflex microscopic     Status: Abnormal   Collection Time: 10/08/18  8:09 PM  Result Value Ref Range   Color, Urine STRAW (A) YELLOW   APPearance CLEAR CLEAR   Specific Gravity, Urine 1.010 1.005 - 1.030   pH 6.0 5.0 - 8.0   Glucose, UA NEGATIVE NEGATIVE mg/dL   Hgb urine dipstick NEGATIVE NEGATIVE   Bilirubin Urine NEGATIVE NEGATIVE   Ketones, ur NEGATIVE NEGATIVE mg/dL   Protein, ur NEGATIVE NEGATIVE mg/dL   Nitrite NEGATIVE NEGATIVE   Leukocytes, UA TRACE (A) NEGATIVE   RBC / HPF 0-5 0 - 5 RBC/hpf   WBC, UA 0-5 0 - 5 WBC/hpf   Bacteria, UA RARE (A) NONE SEEN   Squamous Epithelial / LPF 0-5 0 - 5    Comment: Performed at [facility], [address] [place], [place] [address]  Acetaminophen level     Status: Abnormal   Collection Time: 10/08/18  8:33 PM  Result Value Ref Range   Acetaminophen (Tylenol), Serum <10 (L) 10 - 30 ug/mL    Comment: (NOTE) Therapeutic  concentrations vary significantly. A range of 10-30 ug/mL  may be an effective concentration for many patients. However, some  are best treated at concentrations outside of this range. Acetaminophen concentrations >150 ug/mL at 4 hours after ingestion  and >50 ug/mL at 12 hours after ingestion are often associated with  toxic reactions. Performed at [facility], [address] [place], [place] [address]   Comprehensive metabolic panel     Status: Abnormal   Collection Time: 10/08/18  8:33 PM  Result Value Ref Range   Sodium 142 135 - 145 mmol/L   Potassium 3.6 3.5 - 5.1 mmol/L   Chloride 114 (H) 98 - 111 mmol/L   CO2 23 22 - 32 mmol/L   Glucose, Bld 107 (H) 70 - 99 mg/dL   BUN 11 6 - 20 mg/dL   Creatinine, Ser 0.93 0.44 - 1.00 mg/dL   Calcium 8.4 (L) 8.9 - 10.3 mg/dL   Total Protein 6.2 (L) 6.5 - 8.1 g/dL   Albumin 3.5 3.5 - 5.0 g/dL   AST 16 15 - 41 U/L   ALT 14 0 - 44 U/L   Alkaline Phosphatase 45 38 - 126 U/L   Total Bilirubin 0.3 0.3 - 1.2 mg/dL   GFR calc non Af Amer >60 >60 mL/min   GFR calc Af Amer >60 >60 mL/min    Comment: (NOTE) The eGFR has been calculated using the CKD EPI equation. This calculation has not been validated in all clinical situations. eGFR's persistently <60 mL/min signify possible Chronic Kidney Disease.    Anion gap 5 5 - 15    Comment: Performed at [facility], [address] [place], [place] [address]  Ethanol     Status: None   Collection Time: 10/08/18  8:33 PM  Result Value Ref Range   Alcohol, Ethyl (B) <10 <10 mg/dL    Comment: (NOTE) Lowest detectable limit for serum alcohol is 10 mg/dL. For medical purposes only. Performed at [facility], [address], [place], [place] [address]   Salicylate level     Status: None   Collection Time: 10/08/18  8:33 PM  Result Value Ref Range   Salicylate Lvl <1.9 2.8 - 30.0 mg/dL    Comment: Performed at [facility], [address] [place], [place] [address]  CBC with Differential     Status:  Abnormal   Collection Time: 10/08/18  8:33 PM  Result Value Ref Range   WBC 9.7 4.0 - 10.5 K/uL   RBC 3.75 (L) 3.87 - 5.11 MIL/uL   Hemoglobin 10.5 (L) 12.0 - 15.0 g/dL   HCT 33.9 (L) 36.0 - 46.0 %   MCV 90.4 80.0 - 100.0 fL   MCH 28.0 26.0 - 34.0 pg   MCHC 31.0 30.0 - 36.0 g/dL   RDW 14.3 11.5 - 15.5 %   Platelets 293 150 - 400 K/uL   nRBC 0.0 0.0 - 0.2 %   Neutrophils Relative % 68 %   Neutro Abs 6.6 1.7 - 7.7 K/uL   Lymphocytes Relative 23 %   Lymphs Abs 2.2 0.7 - 4.0 K/uL   Monocytes Relative 6 %   Monocytes Absolute 0.5 0.1 - 1.0 K/uL   Eosinophils Relative 2 %   Eosinophils Absolute 0.2 0.0 - 0.5 K/uL   Basophils Relative 1 %   Basophils Absolute 0.1 0.0 - 0.1 K/uL   Immature Granulocytes 0 %   Abs Immature Granulocytes 0.03 0.00 - 0.07 K/uL    Comment: Performed at [facility], [address], [place], [place] [address]  Protime-INR     Status: None   Collection Time: 10/08/18  8:33 PM  Result Value Ref Range   Prothrombin Time 12.4 11.4 - 15.2 seconds   INR 0.93     Comment: Performed at [facility], [address], [place], [place] [address]  Phenytoin level, total     Status: Abnormal   Collection Time: 10/08/18  8:33 PM  Result Value Ref Range   Phenytoin Lvl <2.5 (L) 10.0 - 20.0 ug/mL    Comment: Performed at [facility], [address] [place], [place] [address]  MRSA PCR Screening     Status: None   Collection Time: 10/08/18 10:50 PM  Result Value Ref Range   MRSA by PCR NEGATIVE NEGATIVE    Comment:        The GeneXpert MRSA Assay (FDA approved for NASAL specimens only), is one component of a comprehensive MRSA colonization surveillance program. It is not intended to diagnose MRSA infection nor to guide or monitor treatment for MRSA infections. Performed at [facility], [address] [place], [place] [address]   HIV antibody (Routine Testing)     Status: None   Collection Time: 10/09/18  2:59 AM  Result Value Ref Range   HIV Screen 4th  Generation wRfx Non Reactive Non Reactive    Comment: (NOTE) Performed At: [facility] [place], [place] [id number] Rush Farmer MD [id number]   Comprehensive metabolic panel     Status: Abnormal   Collection Time: 10/09/18  2:59 AM  Result Value Ref Range   Sodium 142 135 - 145 mmol/L   Potassium 3.7 3.5 - 5.1 mmol/L   Chloride 117 (H) 98 - 111 mmol/L   CO2 19 (L) 22 - 32 mmol/L   Glucose, Bld 97 70 - 99 mg/dL   BUN 9 6 - 20 mg/dL   Creatinine, Ser 0.77 0.44 - 1.00 mg/dL   Calcium 7.5 (L) 8.9 - 10.3 mg/dL   Total Protein 5.4 (L) 6.5 - 8.1 g/dL   Albumin 3.0 (L) 3.5 - 5.0 g/dL   AST  12 (L) 15 - 41 U/L   ALT 12 0 - 44 U/L   Alkaline Phosphatase 37 (L) 38 - 126 U/L   Total Bilirubin 0.3 0.3 - 1.2 mg/dL   GFR calc non Af Amer >60 >60 mL/min   GFR calc Af Amer >60 >60 mL/min    Comment: (NOTE) The eGFR has been calculated using the CKD EPI equation. This calculation has not been validated in all clinical situations. eGFR's persistently <60 mL/min signify possible Chronic Kidney Disease.    Anion gap 6 5 - 15    Comment: Performed at [facility], [address] [place], [place] [address]  Acetaminophen level     Status: Abnormal   Collection Time: 10/09/18  2:59 AM  Result Value Ref Range   Acetaminophen (Tylenol), Serum <10 (L) 10 - 30 ug/mL    Comment: (NOTE) Therapeutic concentrations vary significantly. A range of 10-30 ug/mL  may be an effective concentration for many patients. However, some  are best treated at concentrations outside of this range. Acetaminophen concentrations >150 ug/mL at 4 hours after ingestion  and >50 ug/mL at 12 hours after ingestion are often associated with  toxic reactions. Performed at [facility], [address] [place], [place] [address]   TSH     Status: None   Collection Time: 10/09/18  3:31 AM  Result Value Ref Range   TSH 0.439 0.350 - 4.500 uIU/mL    Comment: Performed by a 3rd Generation assay with a  functional sensitivity of <=0.01 uIU/mL. Performed at [facility], [address] [place], [place] [address]     Blood Alcohol level:  Lab Results  Component Value Date   [facility] <10 10/08/2018   ETH <11 91/47/8295    Metabolic Disorder Labs:  No results found for: HGBA1C, MPG No results found for: PROLACTIN No results found for: CHOL, TRIG, HDL, CHOLHDL, VLDL, LDLCALC  Current Medications: Current Facility-Administered Medications  Medication Dose Route Frequency Provider Last Rate Last Dose  . alum & mag hydroxide-simeth (MAALOX/MYLANTA) 200-200-20 MG/5ML suspension 30 mL  30 mL Oral Q4H PRN Patriciaann Clan E, PA-C      . chlordiazePOXIDE (LIBRIUM) capsule 25 mg  25 mg Oral QID PRN Sharma Covert, MD      . cloNIDine (CATAPRES) tablet 0.1 mg  0.1 mg Oral QID Patriciaann Clan E, PA-C   0.1 mg at 10/10/18 0816   Followed by  . [START ON 10/12/2018] cloNIDine (CATAPRES) tablet 0.1 mg  0.1 mg Oral BH-qamhs Simon, Spencer E, PA-C       Followed by  . [START ON 10/15/2018] cloNIDine (CATAPRES) tablet 0.1 mg  0.1 mg Oral QAC breakfast Laverle Hobby, PA-C      . dicyclomine (BENTYL) tablet 20 mg  20 mg Oral Q6H PRN Laverle Hobby, PA-C      . escitalopram (LEXAPRO) tablet 10 mg  10 mg Oral Daily Sharma Covert, MD   10 mg at 10/10/18 0816  . gabapentin (NEURONTIN) capsule 100 mg  100 mg Oral TID Sharma Covert, MD   100 mg at 10/10/18 0846  . hydrOXYzine (ATARAX/VISTARIL) tablet 25 mg  25 mg Oral Q6H PRN Laverle Hobby, PA-C   25 mg at 10/10/18 0330  . levETIRAcetam (KEPPRA) tablet 500 mg  500 mg Oral BID Sharma Covert, MD   500 mg at 10/10/18 0816  . loperamide (IMODIUM) capsule 2-4 mg  2-4 mg Oral PRN Laverle Hobby, PA-C      .  magnesium hydroxide (MILK OF MAGNESIA) suspension 30 mL  30 mL Oral Daily PRN Laverle Hobby, PA-C      . methocarbamol (ROBAXIN) tablet 500 mg  500 mg Oral Q8H PRN Laverle Hobby, PA-C      . naproxen (NAPROSYN) tablet 500 mg  500 mg  Oral BID PRN Patriciaann Clan E, PA-C   500 mg at 10/10/18 0330  . nicotine (NICODERM CQ - dosed in mg/24 hours) patch 21 mg  21 mg Transdermal Daily Patriciaann Clan E, PA-C   21 mg at 10/10/18 0817  . traZODone (DESYREL) tablet 50 mg  50 mg Oral QHS,MR X 1 Simon, Spencer E, PA-C       PTA Medications: Medications Prior to Admission  Medication Sig Dispense Refill Last Dose  . levETIRAcetam (KEPPRA) 500 MG tablet Take 500 mg by mouth 2 (two) times daily.  0 VERIFY  . SUMAtriptan (IMITREX) 25 MG tablet Take 1 tablet (25 mg total) by mouth daily as needed for migraine. 10 tablet 0     Musculoskeletal: Strength & Muscle Tone: within normal limits Gait & Station: normal Patient leans: N/A  Psychiatric Specialty Exam: Physical Exam  Nursing note and vitals reviewed. Constitutional: She is oriented to person, place, and time. She appears well-developed and well-nourished.  HENT:  Head: Normocephalic and atraumatic.  Respiratory: Effort normal.  Neurological: She is alert and oriented to person, place, and time.    ROS  Blood pressure 124/70, pulse 61, temperature 98 F (36.7 C), temperature source Oral, resp. rate 16, height 5' 3"  (1.6 m), weight 46.7 kg.Body mass index is 18.25 kg/m.  General Appearance: Disheveled  Eye Contact:  Fair  Speech:  Pressured  Volume:  Increased  Mood:  Irritable  Affect:  Congruent  Thought Process:  Coherent and Descriptions of Associations: Circumstantial  Orientation:  Full (Time, Place, and Person)  Thought Content:  Logical  Suicidal Thoughts:  Yes.  with intent/plan  Homicidal Thoughts:  No  Memory:  Immediate;   Fair Recent;   Fair Remote;   Fair  Judgement:  Impaired  Insight:  Lacking  Psychomotor Activity:  Increased  Concentration:  Concentration: Fair and Attention Span: Fair  Recall:  [employer] of Knowledge:  Fair  Language:  Good  Akathisia:  Negative  Handed:  Right  AIMS (if indicated):     Assets:  Communication  Skills Desire for Improvement Housing Leisure Time Physical Health Resilience  ADL's:  Intact  Cognition:  WNL  Sleep:  Number of Hours: 2    Treatment Plan Summary: Daily contact with patient to assess and evaluate symptoms and progress in treatment, Medication management and Plan : Patient is seen and examined.  Patient is a 37 year old female with the above-stated past psychiatric history who was admitted after an intentional overdose of Wellbutrin.  She will be admitted to the hospital.  She will be integrated into the milieu.  She will be encouraged to attend groups.  She will be placed on Librium 25 mg p.o. every 6 hours as needed CIWA greater than 10.  She will also be restarted on her Lexapro 10 mg p.o. daily.  She will also have hydroxyzine for anxiety, and I have also added gabapentin 100 mg p.o. 3 times daily and we will titrate this.  She has a reported history of seizure disorder.  Apparently when she ran out of Xanax previously she had a seizure.  They had placed her on Keppra.  She admitted that she  had seizures from withdrawal in the past.  I have restarted her Keppra 500 mg p.o. twice daily, and started seizure precautions.  We will collect collateral information from her husband, and hopefully be able to get her stabilized.  Observation Level/Precautions:  15 minute checks Seizure  Laboratory:  Chemistry Profile  Psychotherapy:    Medications:    Consultations:    Discharge Concerns:    Estimated LOS:  Other:     Physician Treatment Plan for Primary Diagnosis: <principal problem not specified> Long Term Goal(s): Improvement in symptoms so as ready for discharge  Short Term Goals: Ability to identify changes in lifestyle to reduce recurrence of condition will improve, Ability to verbalize feelings will improve, Ability to disclose and discuss suicidal ideas, Ability to demonstrate self-control will improve, Ability to identify and develop effective coping behaviors will  improve, Ability to maintain clinical measurements within normal limits will improve and Ability to identify triggers associated with substance abuse/mental health issues will improve  Physician Treatment Plan for Secondary Diagnosis: Active Problems:   MDD (major depressive disorder), recurrent episode, severe ([place])  Long Term Goal(s): Improvement in symptoms so as ready for discharge  Short Term Goals: Ability to identify changes in lifestyle to reduce recurrence of condition will improve, Ability to verbalize feelings will improve, Ability to disclose and discuss suicidal ideas, Ability to demonstrate self-control will improve, Ability to identify and develop effective coping behaviors will improve, Ability to maintain clinical measurements within normal limits will improve and Ability to identify triggers associated with substance abuse/mental health issues will improve  I certify that inpatient services furnished can reasonably be expected to improve the patient's condition.    Sharma Covert, MD 10/27/201911:07 AM

## 2018-10-10 NOTE — [facility] Group Notes (Signed)
[facility] Group Notes:  (Nursing/MHT/Case Management/Adjunct)  Date:  10/10/2018  Time:  6:02 PM  Type of Therapy:  Nurse Education  Participation Level:  Did Not Attend  Summary of Progress/Problems: Today the RN discussed healthy coping skills and positive self-image. The patient did not attend.  Kirstie Mirza 10/10/2018, 6:02 PM

## 2018-10-10 NOTE — [facility] Group Notes (Signed)
[facility] LCSW Group Therapy Note  10/10/2018  10:00-11:00AM  Type of Therapy and Topic:  Group Therapy:  Adding Supports Including Being Your Own Support  Participation Level:  Active   Description of Group:  Patients in this group were introduced to the concept that additional supports including self-support are an essential part of recovery.  A song entitled "I Need Help!" was played and a group discussion was held in reaction to the idea of needing to add supports.  A song entitled "My Own Hero" was played and a group discussion ensued in which patients stated they could relate to the song and it inspired them to realize they have be willing to help themselves in order to succeed, because other people cannot achieve sobriety or stability for them.  We discussed adding a variety of healthy supports to address the various needs in their lives.  A song was played called "I Know Where I've Been" toward the end of group and used to conduct an inspirational wrap-up to group of remembering how far they have already come in their journey.  Therapeutic Goals: 1)  demonstrate the importance of being a part of one's own support system 2)  discuss reasons people in one's life may eventually be unable to be continually supportive  3)  identify the patient's current support system and   4)  elicit commitments to add healthy supports and to become more conscious of being self-supportive   Summary of Patient Progress:  The patient expressed that her family including her 3yo and 10yo sons are her healthy supports and motivate her to become better, seek wellness and actually focus on herself, while there are other family members who are unhealthy for her.  She was the most vocal person in group and was focused, appropriate, and insightful.   Therapeutic Modalities:   Motivational Interviewing Activity  Lynnell Chad

## 2018-10-10 NOTE — [facility] Group Notes (Signed)
Adult Psychoeducational Group Note  Date:  10/10/2018 Time:  9:11 AM  Group Topic/Focus:  Goals Group:   The focus of this group is to help patients establish daily goals to achieve during treatment and discuss how the patient can incorporate goal setting into their daily lives to aide in recovery.  Participation Level:  Active  Participation Quality:  Appropriate  Affect:  Appropriate  Cognitive:  Alert  Insight: Appropriate  Engagement in Group:  Engaged  Modes of Intervention:  Orientation  Additional Comments:  Pt attended and participated in orientation/goals group. Pt goal for today is to get adjusted to the unit and reach out to her family.  Dellia Nims 10/10/2018, 9:11 AM

## 2018-10-10 NOTE — Progress Notes (Signed)
Adult Psychoeducational Group Note  Date:  10/10/2018 Time:  9:28 PM  Group Topic/Focus:  Wrap-Up Group:   The focus of this group is to help patients review their daily goal of treatment and discuss progress on daily workbooks.  Participation Level:  Active  Participation Quality:  Appropriate  Affect:  Appropriate  Cognitive:  Appropriate  Insight: Appropriate  Engagement in Group:  Engaged  Modes of Intervention:  Discussion  Additional Comments:  Pt's goal was to go to group and to keep having good, positive thoughts. Pt stated goals were met.  Pt rated the day at a 6/10.    10/10/2018, 9:28 PM 

## 2018-10-10 NOTE — Plan of Care (Addendum)
D: Patient presents depressed, anxious, labile. She is scoring 6 on CIWAs and 3 on COWS. She reports frequent use of benzos at home and occasional infrequent use of opioids. Will monitor closely for alcohol withdrawal given this information. Patient denies SI/HI/AVH.  A: Patient checked q15 min, and checks reviewed. Reviewed medication changes with patient and educated on side effects. Educated patient on importance of attending group therapy sessions and educated on several coping skills. Encouarged participation in milieu through recreation therapy and attending meals with peers. Support and encouragement provided. Fluids offered. R: Patient receptive to education on medications, and is medication compliant. Patient contracts for safety on the unit.

## 2018-10-10 NOTE — Tx Team (Signed)
Initial Treatment Plan 10/10/2018 3:55 AM SHANASIA IBRAHIM ZOX:[id number]    PATIENT STRESSORS: Financial difficulties Marital or family conflict Medication change or noncompliance Occupational concerns Substance abuse   PATIENT STRENGTHS: Wellsite geologist fund of knowledge   PATIENT IDENTIFIED PROBLEMS: Depression  Suicidal ideation/attempt  Substance abuse  "To get back to my normal self"  "Better than I was and not be so depressed"             DISCHARGE CRITERIA:  Improved stabilization in mood, thinking, and/or behavior Verbal commitment to aftercare and medication compliance Withdrawal symptoms are absent or subacute and managed without 24-hour nursing intervention  PRELIMINARY DISCHARGE PLAN: Outpatient therapy Medication management  PATIENT/FAMILY INVOLVEMENT: This treatment plan has been presented to and reviewed with the patient, Shamiah C Aldous.  The patient and family have been given the opportunity to ask questions and make suggestions.  Levin Bacon, RN 10/10/2018, 3:55 AM

## 2018-10-10 NOTE — Progress Notes (Signed)
D: Pt was in the hallway upon initial approach.  Pt presents with anxious affect and mood.  She describes her day as "okay" and reports goal is "to get better while I'm here."  She reports "I went to my meetings" today and reports they were helpful.  Pt denies SI/HI, denies hallucinations, denies pain, denies withdrawal symptoms.  Pt has been visible in milieu interacting with peers and staff appropriately.  Pt attended evening group.    A: Introduced self to pt.  Met with pt 1:1.  Actively listened to pt and offered support and encouragement. Medications administered per order.  Q15 minute safety checks maintained.  R: Pt is safe on the unit.  Pt is compliant with medications.  Pt verbally contracts for safety.  Will continue to monitor and assess.

## 2018-10-10 NOTE — Progress Notes (Signed)
Brianna Kim is a 37 year old female being admitted involuntarily to [facility] from [facility].  She was admitted medically after an overdose attempt on 30 Wellbutrin 150mg  tablets.  She voiced that multiple stressors lead her to the attempt.  During [facility] admission, she was irritable and argumentative.  She was upset about the visiting hours and was difficult to redirect at times.  She currently denies SI/HI or A/V hallucinations.  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  She also state a medication change to Wellbutrin and Cymbalta as a precursor to the suicide attempt.  She reported history of seizures but "that happened after a doctor took me off of xanax 4 years ago and I have been on Keppra ever since."  She admits to using marijuana occasionally, taking her husbands norco "once in a while" and taking some of her husbands diazepam recently.  She is very upset about the new doctor taking her off xanax and "I want something to help with my panic attacks."  Oriented her to the unit.  Admission paperwork completed and signed.  Belongings searched and secured in locker # 28, no contraband found.  Skin assessment completed and no skin issues noted.  Q 15 minute checks initiated for safety.  We will continue to monitor the progress towards her goals.

## 2018-10-11 MED ORDER — GABAPENTIN 100 MG PO CAPS
200.0000 mg | ORAL_CAPSULE | Freq: Three times a day (TID) | ORAL | Status: DC
  Administered 2018-10-11 – 2018-10-12 (×2): 200 mg via ORAL
  Filled 2018-10-11 (×8): qty 2

## 2018-10-11 NOTE — Tx Team (Signed)
Interdisciplinary Treatment and Diagnostic Plan Update  10/11/2018 Time of Session:  Brianna Kim MRN: [id number]  Principal Diagnosis: <principal problem not specified>  Secondary Diagnoses: Active Problems:   MDD (major depressive disorder), recurrent episode, severe (HCC)   Current Medications:  Current Facility-Administered Medications  Medication Dose Route Frequency Provider Last Rate Last Dose  . alum & mag hydroxide-simeth (MAALOX/MYLANTA) 200-200-20 MG/5ML suspension 30 mL  30 mL Oral Q4H PRN Donell Sievert E, PA-C   30 mL at 10/10/18 1357  . chlordiazePOXIDE (LIBRIUM) capsule 25 mg  25 mg Oral QID PRN Antonieta Pert, MD      . cloNIDine (CATAPRES) tablet 0.1 mg  0.1 mg Oral QID Donell Sievert E, PA-C   0.1 mg at 10/11/18 0834   Followed by  . [START ON 10/12/2018] cloNIDine (CATAPRES) tablet 0.1 mg  0.1 mg Oral BH-qamhs Simon, Spencer E, PA-C       Followed by  . [START ON 10/15/2018] cloNIDine (CATAPRES) tablet 0.1 mg  0.1 mg Oral QAC breakfast Donell Sievert E, PA-C      . dicyclomine (BENTYL) tablet 20 mg  20 mg Oral Q6H PRN Donell Sievert E, PA-C   20 mg at 10/11/18 0524  . escitalopram (LEXAPRO) tablet 10 mg  10 mg Oral Daily Antonieta Pert, MD   10 mg at 10/11/18 0834  . gabapentin (NEURONTIN) capsule 100 mg  100 mg Oral TID Antonieta Pert, MD   100 mg at 10/11/18 0834  . hydrOXYzine (ATARAX/VISTARIL) tablet 25 mg  25 mg Oral Q6H PRN Kerry Hough, PA-C   25 mg at 10/11/18 0526  . levETIRAcetam (KEPPRA) tablet 500 mg  500 mg Oral BID Antonieta Pert, MD   500 mg at 10/11/18 4696  . loperamide (IMODIUM) capsule 2-4 mg  2-4 mg Oral PRN Donell Sievert E, PA-C      . magnesium hydroxide (MILK OF MAGNESIA) suspension 30 mL  30 mL Oral Daily PRN Kerry Hough, PA-C      . methocarbamol (ROBAXIN) tablet 500 mg  500 mg Oral Q8H PRN Kerry Hough, PA-C      . naproxen (NAPROSYN) tablet 500 mg  500 mg Oral BID PRN Donell Sievert E, PA-C   500 mg at  10/11/18 0525  . nicotine (NICODERM CQ - dosed in mg/24 hours) patch 21 mg  21 mg Transdermal Daily Donell Sievert E, PA-C   21 mg at 10/11/18 2952  . traZODone (DESYREL) tablet 50 mg  50 mg Oral QHS,MR X 1 Kerry Hough, PA-C   50 mg at 10/10/18 2140   PTA Medications: Medications Prior to Admission  Medication Sig Dispense Refill Last Dose  . ALPRAZolam (XANAX) 0.5 MG tablet Take 0.5 mg by mouth at bedtime as needed for anxiety.     . DULoxetine (CYMBALTA) 30 MG capsule Take 30 mg by mouth daily.   Past Month at Unknown time  . escitalopram (LEXAPRO) 10 MG tablet Take 10 mg by mouth daily.   Past Month at Unknown time  . levETIRAcetam (KEPPRA) 500 MG tablet Take 500 mg by mouth 2 (two) times daily.  0 VERIFY  . SUMAtriptan (IMITREX) 25 MG tablet Take 1 tablet (25 mg total) by mouth daily as needed for migraine. 10 tablet 0     Patient Stressors: Financial difficulties Marital or family conflict Medication change or noncompliance Occupational concerns Substance abuse  Patient Strengths: Wellsite geologist fund of knowledge  Treatment Modalities: Medication Management, Group therapy,  Case management,  1 to 1 session with clinician, Psychoeducation, Recreational therapy.   Physician Treatment Plan for Primary Diagnosis: <principal problem not specified> Long Term Goal(s): Improvement in symptoms so as ready for discharge Improvement in symptoms so as ready for discharge   Short Term Goals: Ability to identify changes in lifestyle to reduce recurrence of condition will improve Ability to verbalize feelings will improve Ability to disclose and discuss suicidal ideas Ability to demonstrate self-control will improve Ability to identify and develop effective coping behaviors will improve Ability to maintain clinical measurements within normal limits will improve Ability to identify triggers associated with substance abuse/mental health issues will improve Ability to  identify changes in lifestyle to reduce recurrence of condition will improve Ability to verbalize feelings will improve Ability to disclose and discuss suicidal ideas Ability to demonstrate self-control will improve Ability to identify and develop effective coping behaviors will improve Ability to maintain clinical measurements within normal limits will improve Ability to identify triggers associated with substance abuse/mental health issues will improve  Medication Management: Evaluate patient's response, side effects, and tolerance of medication regimen.  Therapeutic Interventions: 1 to 1 sessions, Unit Group sessions and Medication administration.  Evaluation of Outcomes: Progressing  Physician Treatment Plan for Secondary Diagnosis: Active Problems:   MDD (major depressive disorder), recurrent episode, severe (HCC)  Long Term Goal(s): Improvement in symptoms so as ready for discharge Improvement in symptoms so as ready for discharge   Short Term Goals: Ability to identify changes in lifestyle to reduce recurrence of condition will improve Ability to verbalize feelings will improve Ability to disclose and discuss suicidal ideas Ability to demonstrate self-control will improve Ability to identify and develop effective coping behaviors will improve Ability to maintain clinical measurements within normal limits will improve Ability to identify triggers associated with substance abuse/mental health issues will improve Ability to identify changes in lifestyle to reduce recurrence of condition will improve Ability to verbalize feelings will improve Ability to disclose and discuss suicidal ideas Ability to demonstrate self-control will improve Ability to identify and develop effective coping behaviors will improve Ability to maintain clinical measurements within normal limits will improve Ability to identify triggers associated with substance abuse/mental health issues will improve      Medication Management: Evaluate patient's response, side effects, and tolerance of medication regimen.  Therapeutic Interventions: 1 to 1 sessions, Unit Group sessions and Medication administration.  Evaluation of Outcomes: Progressing   RN Treatment Plan for Primary Diagnosis: <principal problem not specified> Long Term Goal(s): Knowledge of disease and therapeutic regimen to maintain health will improve  Short Term Goals: Ability to participate in decision making will improve, Ability to verbalize feelings will improve, Ability to disclose and discuss suicidal ideas, Ability to identify and develop effective coping behaviors will improve and Compliance with prescribed medications will improve  Medication Management: RN will administer medications as ordered by provider, will assess and evaluate patient's response and provide education to patient for prescribed medication. RN will report any adverse and/or side effects to prescribing provider.  Therapeutic Interventions: 1 on 1 counseling sessions, Psychoeducation, Medication administration, Evaluate responses to treatment, Monitor vital signs and CBGs as ordered, Perform/monitor CIWA, COWS, AIMS and Fall Risk screenings as ordered, Perform wound care treatments as ordered.  Evaluation of Outcomes: Progressing   LCSW Treatment Plan for Primary Diagnosis: <principal problem not specified> Long Term Goal(s): Safe transition to appropriate next level of care at discharge, Engage patient in therapeutic group addressing interpersonal concerns.  Short Term Goals: Engage  patient in aftercare planning with referrals and resources  Therapeutic Interventions: Assess for all discharge needs, 1 to 1 time with Social worker, Explore available resources and support systems, Assess for adequacy in community support network, Educate family and significant other(s) on suicide prevention, Complete Psychosocial Assessment, Interpersonal group  therapy.  Evaluation of Outcomes: Progressing   Progress in Treatment: Attending groups: No. Participating in groups: No. Taking medication as prescribed: Yes. Toleration medication: Yes. Family/Significant other contact made: No, will contact:  if the patient provides consent Patient understands diagnosis: Yes. Discussing patient identified problems/goals with staff: Yes. Medical problems stabilized or resolved: Yes. Denies suicidal/homicidal ideation: Yes. Issues/concerns per patient self-inventory: No. Other:   New problem(s) identified: None   New Short Term/Long Term Goal(s): medication stabilization, elimination of SI thoughts, development of comprehensive mental wellness plan.    Patient Goals:  I want to get back to my normal self and be better that I was and not be so depressed.  Discharge Plan or Barriers: CSW will assess for appropriate referrals and possible discharge planning.   Reason for Continuation of Hospitalization: Depression Medication stabilization  Estimated Length of Stay: 3-5 days   Attendees: Patient: 10/11/2018 9:00 AM  Physician: Dr. Landry Mellow, MD 10/11/2018 9:00 AM  Nursing: Rayfield Citizen.Leonard Schwartz, RN 10/11/2018 9:00 AM  RN Care Manager: Onnie Boer, RN 10/11/2018 9:00 AM  Social Worker: Baldo Daub, LCSWA 10/11/2018 9:00 AM  Recreational Therapist: Juliann Pares 10/11/2018 9:00 AM  Other: Hillery Jacks, NP 10/11/2018 9:00 AM  Other: Serena Colonel, NP 10/11/2018 9:00 AM  Other: 10/11/2018 9:00 AM    Scribe for Treatment Team: Maeola Sarah, LCSWA 10/11/2018 9:00 AM

## 2018-10-11 NOTE — Progress Notes (Signed)
Pt complaining of feeling tired, BP assessed WNL pulse 47. Spencer PA made aware no new orders given. RN encouraged fluids and gatoraid given RN will continue to monitor

## 2018-10-11 NOTE — Progress Notes (Signed)
D: Patient has been talking on the phone.  She was reminded that that we have a 15-minute rule.  She snapped, "I just got on here."  She was asked to drop her voice, as the doctor can hear her in his office.  Patient was on the clonidine protocol, however, it has been discontinued per MD. She rates her depression and hopelessness as an 8; anxiety as a 9.  She denies any thoughts of self harm.  Patient was observed arguing with her children's father on the phone.  Her goal today is to "just continue to go to groups and be a nice and happy person." Her affect is bright; she denies any withdrawal symptoms.  A: Continue to monitor medication management and MD orders.  Safety checks completed every 15 minutes per protocol.  Offer support and encouragement as needed.  R: Patient is receptive to staff; her behavior is appropriate.

## 2018-10-11 NOTE — Plan of Care (Signed)
  Problem: Education: Goal: Knowledge of [facility] Education information/materials will improve Outcome: Progressing Goal: Emotional status will improve Outcome: Progressing Goal: Mental status will improve Outcome: Progressing Goal: Verbalization of understanding the information provided will improve Outcome: Progressing   Problem: Activity: Goal: Interest or engagement in activities will improve Outcome: Progressing Goal: Sleeping patterns will improve Outcome: Progressing   Problem: Coping: Goal: Ability to verbalize frustrations and anger appropriately will improve Outcome: Progressing Goal: Ability to demonstrate self-control will improve Outcome: Progressing   Problem: Health Behavior/Discharge Planning: Goal: Identification of resources available to assist in meeting health care needs will improve Outcome: Progressing Goal: Compliance with treatment plan for underlying cause of condition will improve Outcome: Progressing   Problem: Physical Regulation: Goal: Ability to maintain clinical measurements within normal limits will improve Outcome: Progressing   Problem: Safety: Goal: Periods of time without injury will increase Outcome: Progressing   Problem: Education: Goal: Ability to make informed decisions regarding treatment will improve Outcome: Progressing   Problem: Coping: Goal: Coping ability will improve Outcome: Progressing   Problem: Health Behavior/Discharge Planning: Goal: Identification of resources available to assist in meeting health care needs will improve Outcome: Progressing   Problem: Medication: Goal: Compliance with prescribed medication regimen will improve Outcome: Progressing   Problem: Self-Concept: Goal: Ability to disclose and discuss suicidal ideas will improve Outcome: Progressing Goal: Will verbalize positive feelings about self Outcome: Progressing   Problem: Education: Goal: Utilization of techniques to improve  thought processes will improve Outcome: Progressing Goal: Knowledge of the prescribed therapeutic regimen will improve Outcome: Progressing   Problem: Activity: Goal: Interest or engagement in leisure activities will improve Outcome: Progressing Goal: Imbalance in normal sleep/wake cycle will improve Outcome: Progressing   Problem: Coping: Goal: Coping ability will improve Outcome: Progressing Goal: Will verbalize feelings Outcome: Progressing   Problem: Health Behavior/Discharge Planning: Goal: Ability to make decisions will improve Outcome: Progressing Goal: Compliance with therapeutic regimen will improve Outcome: Progressing   Problem: Role Relationship: Goal: Will demonstrate positive changes in social behaviors and relationships Outcome: Progressing   Problem: Safety: Goal: Ability to disclose and discuss suicidal ideas will improve Outcome: Progressing Goal: Ability to identify and utilize support systems that promote safety will improve Outcome: Progressing   Problem: Self-Concept: Goal: Will verbalize positive feelings about self Outcome: Progressing Goal: Level of anxiety will decrease Outcome: Progressing   Problem: Education: Goal: Knowledge of disease or condition will improve Outcome: Progressing   Problem: Health Behavior/Discharge Planning: Goal: Ability to identify changes in lifestyle to reduce recurrence of condition will improve Outcome: Progressing   Problem: Physical Regulation: Goal: Complications related to the disease process, condition or treatment will be avoided or minimized Outcome: Progressing   Problem: Safety: Goal: Ability to remain free from injury will improve Outcome: Progressing

## 2018-10-11 NOTE — [facility] Counselor (Signed)
CSW attempted to complete the assessment, however the patient was asleep and did not respond to her name being called numerous times. CSW will attempt to complete the patient's psyco-social assessment at a later time.   Baldo Daub, MSW, LCSWA Clinical Social Worker [facility]  Phone: [phone]

## 2018-10-11 NOTE — Progress Notes (Signed)
[facility] MD Progress Note  10/11/2018 2:50 PM JANN MILKOVICH  MRN:  [id number] Subjective:  Patient is seen and examined. Patient is a 37 year old female with a reported past psychiatric history significant for major depression and generalized anxiety disorder who presented to the [facility] emergency department on 10/08/2018 after an intentional overdose of Wellbutrin. The patient stated that she felt as though her life was falling apart. She stated that over the last 2 weeks she had dressed only in sweatpants and slept a lot. She admitted to helplessness, hopelessness and worthlessness. She had no motivation. She stated that she had been being treated by a nurse practitioner near her home. She stated that she had been on several antidepressants including Paxil and Zoloft. She stated that neither of these medications worked. She stated she is also been using Xanax. The patient during the interview admitted that she had been abusing Xanax since she was aged 26, and there is a notation in the chart from her husband who reported she was overusing it as well. Her previous nurse practitioner had decided to stop her Xanax. She apparently had "seizures" after that. She recently saw a new nurse practitioner who had restarted the Xanax. The [facility] database shows her last prescription for Xanax was filled on 08/17/2018. She was given 0.5 mg and 60 tablets. She stated that her marriage was not going well, and she had conflicts there ongoing with her mother. She stated this is what led to much of her depression. She stated that she had been previously treated with Lexapro earlier this year, and had done well with it. She was unclear on why they had switched her to Wellbutrin. He had also wanted to place her on Cymbalta. Her drug screen on admission showed the presence of benzodiazepines, opiates and marijuana. The patient reported that she is only used marijuana times once in the last month. She  also stated that she had gotten Valium as well as a Norco tablet from her spouse's medication. She was admitted to the hospital for evaluation and stabilization.  Objective: Patient is seen and examined.  Patient is a 37 year old female with a past psychiatric history significant for major depression and generalized anxiety disorder.  She is seen in follow-up.  The patient is spent a great deal the morning on the telephone talking to friends.  She is animated, smiling and laughing for majority of time.  When I talked her in the afternoon she stated she was anxious, and had invited her mother to come visit.  We discussed her medications which include Lexapro.  She has not shown any evidence of alcohol or opiate withdrawal.  Vital signs are stable, she is afebrile.  She slept 6.25 hours last night.  She denied any current suicidality.  She is frustrated with her family, especially her mother for demanding towards her.  She also stated that if her husband did not like the way she was that he could leave.  Principal Problem: <principal problem not specified> Diagnosis:   Patient Active Problem List   Diagnosis Date Noted  . MDD (major depressive disorder), recurrent episode, severe (HCC) [F33.2] 10/10/2018  . Drug overdose, intentional self-harm, initial encounter (HCC) [T50.902A] 10/08/2018  . Seizure disorder (HCC) [G40.909] 10/08/2018  . Depression [F32.9] 10/08/2018   Total Time spent with patient: 20 minutes  Past Psychiatric History: See admission H&P  Past Medical History:  Past Medical History:  Diagnosis Date  . Seizures (HCC)    History reviewed. No pertinent  surgical history. Family History: History reviewed. No pertinent family history. Family Psychiatric  History: See admission H&P Social History:  Social History   Substance and Sexual Activity  Alcohol Use No     Social History   Substance and Sexual Activity  Drug Use No    Social History   Socioeconomic History  .  Marital status: Married    Spouse name: Not on file  . Number of children: Not on file  . Years of education: Not on file  . Highest education level: Not on file  Occupational History  . Not on file  Social Needs  . Financial resource strain: Not on file  . Food insecurity:    Worry: Not on file    Inability: Not on file  . Transportation needs:    Medical: Not on file    Non-medical: Not on file  Tobacco Use  . Smoking status: Former Smoker    Packs/day: 0.03    Years: 0.00    Pack years: 0.00  . Smokeless tobacco: Never Used  Substance and Sexual Activity  . Alcohol use: No  . Drug use: No  . Sexual activity: Yes    Birth control/protection: None  Lifestyle  . Physical activity:    Days per week: Not on file    Minutes per session: Not on file  . Stress: Not on file  Relationships  . Social connections:    Talks on phone: Not on file    Gets together: Not on file    Attends religious service: Not on file    Active member of club or organization: Not on file    Attends meetings of clubs or organizations: Not on file    Relationship status: Not on file  Other Topics Concern  . Not on file  Social History Narrative  . Not on file   Additional Social History:                         Sleep: Good  Appetite:  Good  Current Medications: Current Facility-Administered Medications  Medication Dose Route Frequency Provider Last Rate Last Dose  . alum & mag hydroxide-simeth (MAALOX/MYLANTA) 200-200-20 MG/5ML suspension 30 mL  30 mL Oral Q4H PRN Donell Sievert E, PA-C   30 mL at 10/10/18 1357  . chlordiazePOXIDE (LIBRIUM) capsule 25 mg  25 mg Oral QID PRN Antonieta Pert, MD      . dicyclomine (BENTYL) tablet 20 mg  20 mg Oral Q6H PRN Donell Sievert E, PA-C   20 mg at 10/11/18 0524  . escitalopram (LEXAPRO) tablet 10 mg  10 mg Oral Daily Antonieta Pert, MD   10 mg at 10/11/18 0834  . gabapentin (NEURONTIN) capsule 200 mg  200 mg Oral TID Antonieta Pert, MD      . hydrOXYzine (ATARAX/VISTARIL) tablet 25 mg  25 mg Oral Q6H PRN Donell Sievert E, PA-C   25 mg at 10/11/18 0526  . levETIRAcetam (KEPPRA) tablet 500 mg  500 mg Oral BID Antonieta Pert, MD   500 mg at 10/11/18 0454  . loperamide (IMODIUM) capsule 2-4 mg  2-4 mg Oral PRN Donell Sievert E, PA-C      . magnesium hydroxide (MILK OF MAGNESIA) suspension 30 mL  30 mL Oral Daily PRN Donell Sievert E, PA-C      . methocarbamol (ROBAXIN) tablet 500 mg  500 mg Oral Q8H PRN Kerry Hough, PA-C      .  naproxen (NAPROSYN) tablet 500 mg  500 mg Oral BID PRN Kerry Hough, PA-C   500 mg at 10/11/18 0525  . nicotine (NICODERM CQ - dosed in mg/24 hours) patch 21 mg  21 mg Transdermal Daily Donell Sievert E, PA-C   21 mg at 10/11/18 8119  . traZODone (DESYREL) tablet 50 mg  50 mg Oral QHS,MR X 1 Kerry Hough, PA-C   50 mg at 10/10/18 2140    Lab Results: No results found for this or any previous visit (from the past 48 hour(s)).  Blood Alcohol level:  Lab Results  Component Value Date   ETH <10 10/08/2018   ETH <11 07/13/2013    Metabolic Disorder Labs: No results found for: HGBA1C, MPG No results found for: PROLACTIN No results found for: CHOL, TRIG, HDL, CHOLHDL, VLDL, LDLCALC  Physical Findings: AIMS: Facial and Oral Movements Muscles of Facial Expression: None, normal Lips and Perioral Area: None, normal Jaw: None, normal Tongue: None, normal,Extremity Movements Upper (arms, wrists, hands, fingers): None, normal Lower (legs, knees, ankles, toes): None, normal, Trunk Movements Neck, shoulders, hips: None, normal, Overall Severity Severity of abnormal movements (highest score from questions above): None, normal Incapacitation due to abnormal movements: None, normal Patient's awareness of abnormal movements (rate only patient's report): No Awareness, Dental Status Current problems with teeth and/or dentures?: No Does patient usually wear dentures?: No  CIWA:   CIWA-Ar Total: 6 COWS:  COWS Total Score: 0  Musculoskeletal: Strength & Muscle Tone: within normal limits Gait & Station: normal Patient leans: N/A  Psychiatric Specialty Exam: Physical Exam  Nursing note and vitals reviewed. Constitutional: She is oriented to person, place, and time. She appears well-developed and well-nourished.  HENT:  Head: Normocephalic and atraumatic.  Respiratory: Effort normal.  Neurological: She is alert and oriented to person, place, and time.    ROS  Blood pressure 120/89, pulse 64, temperature 97.9 F (36.6 C), temperature source Oral, resp. rate 20, height 5\' 3"  (1.6 m), weight 46.7 kg.Body mass index is 18.25 kg/m.  General Appearance: Casual  Eye Contact:  Fair  Speech:  Normal Rate  Volume:  Normal  Mood:  Anxious  Affect:  Congruent  Thought Process:  Coherent and Descriptions of Associations: Intact  Orientation:  Full (Time, Place, and Person)  Thought Content:  Logical  Suicidal Thoughts:  No  Homicidal Thoughts:  No  Memory:  Immediate;   Fair Recent;   Fair Remote;   Fair  Judgement:  Intact  Insight:  Fair  Psychomotor Activity:  Increased  Concentration:  Concentration: Fair and Attention Span: Fair  Recall:  [employer] of Knowledge:  Fair  Language:  Fair  Akathisia:  Negative  Handed:  Right  AIMS (if indicated):     Assets:  Communication Skills Desire for Improvement Housing Physical Health Resilience Social Support  ADL's:  Intact  Cognition:  WNL  Sleep:  Number of Hours: 6.25     Treatment Plan Summary: Daily contact with patient to assess and evaluate symptoms and progress in treatment, Medication management and Plan : Patient is seen and examined.  Patient is a 37 year old female with the above-stated past psychiatric history who is seen in follow-up.  #1 major depression-continue Lexapro 10 mg p.o. daily.  #2 generalized anxiety disorder-continue Lexapro 10 mg p.o. daily, continue gabapentin but increase to  200 mg p.o. 3 times daily.  Continue hydroxyzine 25 mg p.o. every 6 hours as needed anxiety.  #3 benzodiazepine use disorder-patient's not showing  any evidence of any active withdrawal at this point.  We will continue the Librium 25 mg p.o. 4 times daily as needed CWA greater than 10.  #4 opiate use disorder-she has not shown any signs or symptoms of withdrawal.  I am going to stop the clonidine detox protocol today.  #5 disposition planning-in progress  Antonieta Pert, MD 10/11/2018, 2:50 PM

## 2018-10-11 NOTE — [facility] Group Notes (Signed)
LCSW Group Therapy Note 10/11/2018 2:34 PM  Type of Therapy and Topic: Group Therapy: Overcoming Obstacles  Participation Level: Did Not Attend  Description of Group:  In this group patients will be encouraged to explore what they see as obstacles to their own wellness and recovery. They will be guided to discuss their thoughts, feelings, and behaviors related to these obstacles. The group will process together ways to cope with barriers, with attention given to specific choices patients can make. Each patient will be challenged to identify changes they are motivated to make in order to overcome their obstacles. This group will be process-oriented, with patients participating in exploration of their own experiences as well as giving and receiving support and challenge from other group members.  Therapeutic Goals: 1. Patient will identify personal and current obstacles as they relate to admission. 2. Patient will identify barriers that currently interfere with their wellness or overcoming obstacles.  3. Patient will identify feelings, thought process and behaviors related to these barriers. 4. Patient will identify two changes they are willing to make to overcome these obstacles:   Summary of Patient Progress  Invited, chose not to attend.    Therapeutic Modalities:  Cognitive Behavioral Therapy Solution Focused Therapy Motivational Interviewing Relapse Prevention Therapy   Alcario Drought Clinical Social Worker

## 2018-10-11 NOTE — Progress Notes (Signed)
Recreation Therapy Notes  Date: 10.28.19 Time: 0930 Location: 300 [address] Dayroom  Group Topic: Stress Management  Goal Area(s) Addresses:  Patient will verbalize importance of using healthy stress management.  Patient will identify positive emotions associated with healthy stress management.   Intervention: Stress Management  Activity :  Meditation.  LRT introduced the stress management technique of meditation.  LRT played a meditation on being resilient.  Patients were to follow along as meditation played to engage in activity.  Education:  Stress Management, Discharge Planning.   Education Outcome: Acknowledges edcuation/In group clarification offered/Needs additional education  Clinical Observations/Feedback: Pt did not attend group.    Caroll Rancher, LRT/CTRS         Caroll Rancher A 10/11/2018 12:01 PM

## 2018-10-12 DIAGNOSIS — F419 Anxiety disorder, unspecified: Secondary | ICD-10-CM

## 2018-10-12 DIAGNOSIS — G4700 Insomnia, unspecified: Secondary | ICD-10-CM

## 2018-10-12 MED ORDER — TRAZODONE HCL 50 MG PO TABS
50.0000 mg | ORAL_TABLET | Freq: Every evening | ORAL | Status: DC | PRN
  Administered 2018-10-12: 50 mg via ORAL
  Filled 2018-10-12: qty 1

## 2018-10-12 MED ORDER — GABAPENTIN 300 MG PO CAPS
300.0000 mg | ORAL_CAPSULE | Freq: Three times a day (TID) | ORAL | Status: DC
  Administered 2018-10-12 – 2018-10-15 (×10): 300 mg via ORAL
  Filled 2018-10-12 (×14): qty 1

## 2018-10-12 NOTE — Progress Notes (Addendum)
Writer spoke with the pt earlier in the shift just after the evening group to introduce self and to ask about her meds for the evening since she is not scheduled any medications at bedtime.  Pt named at least four medications that she needed for pain, anxiety, "stomach cramps", and sleep.  Writer affirmed these and told her that when she was ready for meds to let staff know.  Previous RN had Web designer that pt was upset and irritable at visitation time because she was not able to see her children as long as she had wanted.  After seeing her kids, her husband came on the unit and visited until 8 pm.  Pt was noted to be talking on the phone in the dayroom most of the rest of the evening.  Just before bedtime, MHT reported that pt came out of the dayroom, looked around, and loudly asked, "where's that nurse?".  Pt then proceeded to bang on the med room door.  MHT came to tell Clinical research associate, and Clinical research associate spoke to pt, telling her that Clinical research associate was waiting for pt to let her know when she wanted her meds.  Pt then turned the conversation around, stating the the MHT had spoken to her harshly and upset her.  "We don't need this f--king negativity.  We are going through depression and I didn't get to see my kids the whole 30 minutes tonight".  Pt displayed loud exasperation in her voice, rubbing her face and sounding as if she were crying, but no tears were visible.  Writer spent some time talking with pt and gave her night time meds   Writer then spoke to the MHTs at the nurse's station who informed writer that the pt was the one who was irritable and negative, seemingly upset with whom she had been talking to on the phone.  Will continue to monitor pt's behaviors and redirect accordingly.  Pt denies SI/HI/AVH at this time.  Support and encouragement offered.  Discharge plans are in process.  Pt is contemplating further treatment after [facility],  Safety maintained with q15 minute checks.
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
## 2018-10-12 NOTE — [facility] Suicide Risk Assessment (Signed)
[facility] INPATIENT:  Family/Significant Other Suicide Prevention Education  Suicide Prevention Education:  Patient Refusal for Family/Significant Other Suicide Prevention Education: The patient Brianna Kim has refused to provide written consent for family/significant other to be provided Family/Significant Other Suicide Prevention Education during admission and/or prior to discharge.  Physician notified.  SPE completed with patient, as patient refused to consent to family contact. Patient was encouraged to share information with support network, ask questions, and talk about any concerns relating to SPE. Patient denies access to guns/firearms and verbalized understanding of information provided. Mobile Crisis information also provided to patient.    Maeola Sarah 10/12/2018, 9:53 AM

## 2018-10-12 NOTE — Progress Notes (Signed)
[facility] MD Progress Note  10/12/2018 11:14 AM Brianna Kim  MRN:  [id number] Subjective: Patient reports some improvement compared to admission .she currently denies medication side effects .At this time denies suicidal ideations. Objective: I have discussed case with treatment team and have met with patient. 37 year old female with a reported past psychiatric history of MDD and GAD, who presented to the [facility] emergency department on 10/08/2018 for worsening depression intentional overdose of Wellbutrin. History of long-term management with Xanax, reports she was taking more than prescribed at times, reports history of withdrawal seizures in the past. Today reports feeling partially better, less depressed than on admission, denies suicidal ideations. No current withdrawal symptoms noted /reported, vitals are stable . Behavior on unit in good control, going to groups, pleasant on approach.    Principal Problem: MDD, status post suicide attempt by overdose  Diagnosis:   Patient Active Problem List   Diagnosis Date Noted  . MDD (major depressive disorder), recurrent episode, severe ([place]) [F33.2] 10/10/2018  . Drug overdose, intentional self-harm, initial encounter ([place]) [T50.902A] 10/08/2018  . Seizure disorder ([place]) [D63.875] 10/08/2018  . Depression [F32.9] 10/08/2018   Total Time spent with patient: 20 minutes  Past Psychiatric History: See admission H&P  Past Medical History:  Past Medical History:  Diagnosis Date  . Seizures ([place])    History reviewed. No pertinent surgical history. Family History: History reviewed. No pertinent family history. Family Psychiatric  History: See admission H&P Social History:  Social History   Substance and Sexual Activity  Alcohol Use No     Social History   Substance and Sexual Activity  Drug Use No    Social History   Socioeconomic History  . Marital status: Married    Spouse name: Not on file  . Number of children: Not on file   . Years of education: Not on file  . Highest education level: Not on file  Occupational History  . Not on file  Social Needs  . Financial resource strain: Not on file  . Food insecurity:    Worry: Not on file    Inability: Not on file  . Transportation needs:    Medical: Not on file    Non-medical: Not on file  Tobacco Use  . Smoking status: Former Smoker    Packs/day: 0.03    Years: 0.00    Pack years: 0.00  . Smokeless tobacco: Never Used  Substance and Sexual Activity  . Alcohol use: No  . Drug use: No  . Sexual activity: Yes    Birth control/protection: None  Lifestyle  . Physical activity:    Days per week: Not on file    Minutes per session: Not on file  . Stress: Not on file  Relationships  . Social connections:    Talks on phone: Not on file    Gets together: Not on file    Attends religious service: Not on file    Active member of club or organization: Not on file    Attends meetings of clubs or organizations: Not on file    Relationship status: Not on file  Other Topics Concern  . Not on file  Social History Narrative  . Not on file   Additional Social History:   Sleep: Good  Appetite:  Good  Current Medications: Current Facility-Administered Medications  Medication Dose Route Frequency Provider Last Rate Last Dose  . alum & mag hydroxide-simeth (MAALOX/MYLANTA) 200-200-20 MG/5ML suspension 30 mL  30 mL Oral Q4H PRN Gilberto Better,  Spencer E, PA-C   30 mL at 10/10/18 1357  . chlordiazePOXIDE (LIBRIUM) capsule 25 mg  25 mg Oral QID PRN Sharma Covert, MD      . dicyclomine (BENTYL) tablet 20 mg  20 mg Oral Q6H PRN Patriciaann Clan E, PA-C   20 mg at 10/12/18 0703  . escitalopram (LEXAPRO) tablet 10 mg  10 mg Oral Daily Sharma Covert, MD   10 mg at 10/12/18 0814  . gabapentin (NEURONTIN) capsule 200 mg  200 mg Oral TID Sharma Covert, MD   200 mg at 10/12/18 0814  . hydrOXYzine (ATARAX/VISTARIL) tablet 25 mg  25 mg Oral Q6H PRN Laverle Hobby, PA-C    25 mg at 10/12/18 0703  . levETIRAcetam (KEPPRA) tablet 500 mg  500 mg Oral BID Sharma Covert, MD   500 mg at 10/12/18 0815  . loperamide (IMODIUM) capsule 2-4 mg  2-4 mg Oral PRN Patriciaann Clan E, PA-C      . magnesium hydroxide (MILK OF MAGNESIA) suspension 30 mL  30 mL Oral Daily PRN Laverle Hobby, PA-C      . methocarbamol (ROBAXIN) tablet 500 mg  500 mg Oral Q8H PRN Patriciaann Clan E, PA-C   500 mg at 10/11/18 2234  . naproxen (NAPROSYN) tablet 500 mg  500 mg Oral BID PRN Laverle Hobby, PA-C   500 mg at 10/11/18 1956  . nicotine (NICODERM CQ - dosed in mg/24 hours) patch 21 mg  21 mg Transdermal Daily Patriciaann Clan E, PA-C   21 mg at 10/12/18 0815  . traZODone (DESYREL) tablet 50 mg  50 mg Oral QHS,MR X 1 Laverle Hobby, PA-C   Stopped at 10/11/18 2239    Lab Results: No results found for this or any previous visit (from the past 48 hour(s)).  Blood Alcohol level:  Lab Results  Component Value Date   ETH <10 10/08/2018   ETH <11 09/47/0962    Metabolic Disorder Labs: No results found for: HGBA1C, MPG No results found for: PROLACTIN No results found for: CHOL, TRIG, HDL, CHOLHDL, VLDL, LDLCALC  Physical Findings: AIMS: Facial and Oral Movements Muscles of Facial Expression: None, normal Lips and Perioral Area: None, normal Jaw: None, normal Tongue: None, normal,Extremity Movements Upper (arms, wrists, hands, fingers): None, normal Lower (legs, knees, ankles, toes): None, normal, Trunk Movements Neck, shoulders, hips: None, normal, Overall Severity Severity of abnormal movements (highest score from questions above): None, normal Incapacitation due to abnormal movements: None, normal Patient's awareness of abnormal movements (rate only patient's report): No Awareness, Dental Status Current problems with teeth and/or dentures?: No Does patient usually wear dentures?: No  CIWA:  CIWA-Ar Total: 6 COWS:  COWS Total Score: 0  Musculoskeletal: Strength & Muscle  Tone: within normal limits Gait & Station: normal Patient leans: N/A  Psychiatric Specialty Exam: Physical Exam  Nursing note and vitals reviewed. Constitutional: She is oriented to person, place, and time. She appears well-developed and well-nourished.  HENT:  Head: Normocephalic and atraumatic.  Respiratory: Effort normal.  Neurological: She is alert and oriented to person, place, and time.    ROS-no chest pain, no shortness of breath, no vomiting  Blood pressure 102/65, pulse 66, temperature 97.6 F (36.4 C), temperature source Oral, resp. rate 20, height 5' 3"  (1.6 m), weight 46.7 kg.Body mass index is 18.25 kg/m.  General Appearance: Well Groomed  Eye Contact:  Good  Speech:  Normal Rate  Volume:  Normal  Mood:  Partially improved mood  Affect:  Reactive, vaguely anxious  Thought Process:  Linear and Descriptions of Associations: Intact  Orientation:  Other:  Fully alert and attentive  Thought Content:  No hallucinations, no delusions  Suicidal Thoughts:  No denies suicidal or self-injurious ideations  Homicidal Thoughts:  No  Memory:  Recent and remote grossly intact  Judgement:  Other:  Improving  Insight:  Improving  Psychomotor Activity:  Normal-no significant tremors, diaphoresis or psychomotor agitation  Concentration:  Concentration: Good and Attention Span: Good  Recall:  Good  Fund of Knowledge:  Good  Language:  Good  Akathisia:  Negative  Handed:  Right  AIMS (if indicated):     Assets:  Communication Skills Desire for Improvement Housing Physical Health Resilience Social Support  ADL's:  Intact  Cognition:  WNL  Sleep:  Number of Hours: 6.75   Assessment -37 year old female, presented for worsening depression and overdose on Wellbutrin.  History of long-term benzodiazepine management, reports she was taking more Xanax than prescribed at times and reports history of withdrawal seizures. Today reports partially improved mood, affect is more reactive,  no suicidal ideations.  Thus far tolerating Lexapro trial well, has history of good response to this medication in the past.   Treatment Plan Summary: Treatment plan reviewed today 10/29 Encourage group and milieu participation to work on coping skills and symptom reduction Treatment team working on disposition planning Continue Lexapro 10 mg daily for depression and anxiety Continue Keppra 500 mg twice a day for history of seizures Increase Neurontin to 300 mg 3 times daily for anxiety Continue Trazodone 50 mgrs QHS PRN for insomnia . Continue Librium 25 mg every 4 hours for withdrawal symptoms as needed   Jenne Campus, MD 10/12/2018, 11:14 AM   Patient ID: Brianna Kim, female   DOB: 02/15/1981, 36 y.o.   MRN: [id number]

## 2018-10-12 NOTE — Progress Notes (Signed)
Nursing note 7p-7a  Pt observed interacting with peers on unit this shift. Displayed a anxious affect and mood upon interaction with this Clinical research associate. Pt complains of leg pain and breast pain, stomach cramps and anxiety. See Mar for prn medication administration. Pt denies SI/HI, and also denies any audio or visual hallucinations at this time.  Pt is able to verbally contract for safety with this RN. During visitation pt gave verbal permission for mom to speak to RN regarding her stay and how she was doing at the moment. Mom stated that she felt the pt was forgetting things more than normal and repeating herself a lot during visitation this shift. After mom left pt stated that she doesn't get along with her mom and her mom is a trigger for her. Pt educated on falls and encouraged to wear nonskid socks when ambulating in the halls. Pt also encouraged to call for help if feeling weak or dizzy. Pt verbalized understanding of all education provided. Pt is now resting in bed with eyes closed, with no signs or symptoms of pain or distress noted. Pt continues to remain safe on the unit and is observed by rounding every 15 min. RN will continue to monitor.

## 2018-10-12 NOTE — Progress Notes (Signed)
D: Patient has been complaining of some abdominal pain that is unresolved.  She was given bentyl and naproxen earlier.  Her neurontin was also increased per MD.  She is visible in the milieu interacting with her peers.  She denies any withdrawal symptoms today.  Her goal today is "to not get angry and lash out."  Patient denies any thoughts of self harm.  Her affect is brighter today.  She rates her depression and hopelessness as a 7; anxiety as a 10.  A: Continue to monitor medication management and MD orders.  Safety checks completed every 15 minutes per protocol.  Offer support and encouragement as needed.  R: Patient is receptive to staff; her behavior is appropriate.

## 2018-10-13 DIAGNOSIS — T424X2A Poisoning by benzodiazepines, intentional self-harm, initial encounter: Secondary | ICD-10-CM

## 2018-10-13 MED ORDER — QUETIAPINE FUMARATE 50 MG PO TABS
50.0000 mg | ORAL_TABLET | Freq: Every day | ORAL | Status: DC
  Administered 2018-10-13: 50 mg via ORAL
  Filled 2018-10-13 (×2): qty 1

## 2018-10-13 MED ORDER — HYDROXYZINE HCL 50 MG PO TABS: 50.0000 mg | ORAL_TABLET | Freq: Four times a day (QID) | ORAL | Status: DC | PRN

## 2018-10-13 MED ORDER — QUETIAPINE FUMARATE 25 MG PO TABS
25.0000 mg | ORAL_TABLET | Freq: Two times a day (BID) | ORAL | Status: DC | PRN
  Administered 2018-10-13 – 2018-10-14 (×2): 25 mg via ORAL
  Filled 2018-10-13 (×2): qty 1

## 2018-10-13 NOTE — Progress Notes (Signed)
Pt presents with a flat affect and depressed mood. Pt reported having increased depression and anxiety. Pt rated on her self inventory sheet depression 9/10, anxiety 9/10 and hopelessness 9/10. Pt denies SI/HI. Pt reported fair sleep last night. Pt expressed to writer that she was agitated yesterday and had an alteration with one of the techs on the hall last night. Pt expressed that some of the staff have an attitude and acts as if they don't want to be here. Pt did not elaborate on who those individuals are. Writer encouraged pt to notify myself or the charge nurse if she continues to have any issues with staff.    Orders reviewed with pt. Verbal support provided. Pt encouraged to attend groups. 15 minute checks performed for safety.   Pt compliant with tx plan. Pt stated goal "not getting angry".

## 2018-10-13 NOTE — Progress Notes (Signed)
Adult Psychoeducational Group Note  Date:  10/13/2018 Time:  10:37 PM  Group Topic/Focus:  Wrap-Up Group:   The focus of this group is to help patients review their daily goal of treatment and discuss progress on daily workbooks.  Participation Level:  Active  Participation Quality:  Appropriate  Affect:  Appropriate  Cognitive:  Appropriate  Insight: Appropriate  Engagement in Group:  Distracting  Modes of Intervention:  Discussion  Additional Comments:  Pt stated she has been really tired today as a result of a medication change.  Pt's goal was to not flip out any staff and the goal was met.  Pt rated the day at a 7/10.  Brianna Kim 10/13/2018, 10:37 PM

## 2018-10-13 NOTE — Progress Notes (Signed)
Recreation Therapy Notes  Date: 10.30.19 Time: 0930 Location: 300 Hall Dayroom  Group Topic: Stress Management  Goal Area(s) Addresses:  Patient will verbalize importance of using healthy stress management.  Patient will identify positive emotions associated with healthy stress management.   Intervention: Stress Management  Activity :  Guided Imagery.  LRT introduced the stress management technique of guided imagery.  LRT read a script that allowed patients to envision seeing the starry sky at night.  Patients were to follow along as the guided imagery was read to fully participate in the meditation.  Education:  Stress Management, Discharge Planning.   Education Outcome: Acknowledges edcuation/In group clarification offered/Needs additional education  Clinical Observations/Feedback: Pt did not attend group.     Liam Cammarata, LRT/CTRS         Brianna Kim A 10/13/2018 11:10 AM 

## 2018-10-13 NOTE — Progress Notes (Addendum)
[facility] MD Progress Note  10/13/2018 2:10 PM Brianna Kim  MRN:  [id number]   Subjective: Patient reports today that she is doing better than she was when she got here.  She denies any suicidal or homicidal ideations and denies any hallucinations today.  She reports that she does deal with some anxiety and some agitation.  She reports that the agitation has been an ongoing problem for her for years.  She is denying any withdrawal symptoms as well today.  She reports sleeping well and having a good appetite.  She states that the biggest thing she wants assistance with is her agitation now.  Patient states that she is going to have her husband go to [facility] and see if he can help her get her court date moved so that she can go to residential treatment.  Objective: Patient's chart and findings reviewed and discussed with treatment team.  Patient presents in the day room and is pleasant, calm, and cooperative.  Patient is seen interacting with peers and staff appropriately.  Patient has been attending groups and going to meals as required.  She has been seen interacting in the day room with peers appropriately.  She reports that she did get agitated with the staff last night and that is another reason she would like to have her agitation controlled better.  Principal Problem: MDD (major depressive disorder), recurrent episode, severe ([facility]) Diagnosis:   Patient Active Problem List   Diagnosis Date Noted  . MDD (major depressive disorder), recurrent episode, severe ([facility]) [F33.2] 10/10/2018  . Drug overdose, intentional self-harm, initial encounter ([facility]) [T50.902A] 10/08/2018  . Seizure disorder ([facility]) [G40.909] 10/08/2018  . Depression [F32.9] 10/08/2018   Total Time spent with patient: 30 minutes  Past Psychiatric History: See H&P  Past Medical History:  Past Medical History:  Diagnosis Date  . Seizures ([facility])    History reviewed. No pertinent surgical history. Family History:  History reviewed. No pertinent family history. Family Psychiatric  History: See H&P Social History:  Social History   Substance and Sexual Activity  Alcohol Use No     Social History   Substance and Sexual Activity  Drug Use No    Social History   Socioeconomic History  . Marital status: Married    Spouse name: Not on file  . Number of children: Not on file  . Years of education: Not on file  . Highest education level: Not on file  Occupational History  . Not on file  Social Needs  . Financial resource strain: Not on file  . Food insecurity:    Worry: Not on file    Inability: Not on file  . Transportation needs:    Medical: Not on file    Non-medical: Not on file  Tobacco Use  . Smoking status: Former Smoker    Packs/day: 0.03    Years: 0.00    Pack years: 0.00  . Smokeless tobacco: Never Used  Substance and Sexual Activity  . Alcohol use: No  . Drug use: No  . Sexual activity: Yes    Birth control/protection: None  Lifestyle  . Physical activity:    Days per week: Not on file    Minutes per session: Not on file  . Stress: Not on file  Relationships  . Social connections:    Talks on phone: Not on file    Gets together: Not on file    Attends religious service: Not on file    Active member  of club or organization: Not on file    Attends meetings of clubs or organizations: Not on file    Relationship status: Not on file  Other Topics Concern  . Not on file  Social History Narrative  . Not on file   Additional Social History:                         Sleep: Good  Appetite:  Good  Current Medications: Current Facility-Administered Medications  Medication Dose Route Frequency Provider Last Rate Last Dose  . alum & mag hydroxide-simeth (MAALOX/MYLANTA) 200-200-20 MG/5ML suspension 30 mL  30 mL Oral Q4H PRN Donell Sievert E, PA-C   30 mL at 10/10/18 1357  . dicyclomine (BENTYL) tablet 20 mg  20 mg Oral Q6H PRN Kerry Hough, PA-C   20 mg  at 10/13/18 0844  . escitalopram (LEXAPRO) tablet 10 mg  10 mg Oral Daily Antonieta Pert, MD   10 mg at 10/13/18 0981  . gabapentin (NEURONTIN) capsule 300 mg  300 mg Oral TID Greycen Felter, Rockey Situ, MD   300 mg at 10/13/18 1216  . hydrOXYzine (ATARAX/VISTARIL) tablet 50 mg  50 mg Oral Q6H PRN Money, Gerlene Burdock, FNP      . levETIRAcetam (KEPPRA) tablet 500 mg  500 mg Oral BID Antonieta Pert, MD   500 mg at 10/13/18 0839  . loperamide (IMODIUM) capsule 2-4 mg  2-4 mg Oral PRN Donell Sievert E, PA-C      . magnesium hydroxide (MILK OF MAGNESIA) suspension 30 mL  30 mL Oral Daily PRN Kerry Hough, PA-C      . methocarbamol (ROBAXIN) tablet 500 mg  500 mg Oral Q8H PRN Donell Sievert E, PA-C   500 mg at 10/12/18 2243  . naproxen (NAPROSYN) tablet 500 mg  500 mg Oral BID PRN Kerry Hough, PA-C   500 mg at 10/13/18 0844  . nicotine (NICODERM CQ - dosed in mg/24 hours) patch 21 mg  21 mg Transdermal Daily Donell Sievert E, PA-C   21 mg at 10/13/18 0839  . QUEtiapine (SEROQUEL) tablet 25 mg  25 mg Oral BID PRN Money, Gerlene Burdock, FNP      . QUEtiapine (SEROQUEL) tablet 50 mg  50 mg Oral QHS Money, Gerlene Burdock, FNP        Lab Results: No results found for this or any previous visit (from the past 48 hour(s)).  Blood Alcohol level:  Lab Results  Component Value Date   ETH <10 10/08/2018   ETH <11 07/13/2013    Metabolic Disorder Labs: No results found for: HGBA1C, MPG No results found for: PROLACTIN No results found for: CHOL, TRIG, HDL, CHOLHDL, VLDL, LDLCALC  Physical Findings: AIMS: Facial and Oral Movements Muscles of Facial Expression: None, normal Lips and Perioral Area: None, normal Jaw: None, normal Tongue: None, normal,Extremity Movements Upper (arms, wrists, hands, fingers): None, normal Lower (legs, knees, ankles, toes): None, normal, Trunk Movements Neck, shoulders, hips: None, normal, Overall Severity Severity of abnormal movements (highest score from questions above): None,  normal Incapacitation due to abnormal movements: None, normal Patient's awareness of abnormal movements (rate only patient's report): No Awareness, Dental Status Current problems with teeth and/or dentures?: No Does patient usually wear dentures?: No  CIWA:  CIWA-Ar Total: 6 COWS:  COWS Total Score: 0  Musculoskeletal: Strength & Muscle Tone: within normal limits Gait & Station: normal Patient leans: N/A  Psychiatric Specialty Exam: Physical Exam  Nursing  note and vitals reviewed. Constitutional: She is oriented to person, place, and time. She appears well-developed and well-nourished.  Cardiovascular: Normal rate.  Respiratory: Effort normal.  Musculoskeletal: Normal range of motion.  Neurological: She is alert and oriented to person, place, and time.  Skin: Skin is warm.    Review of Systems  Constitutional: Negative.   HENT: Negative.   Eyes: Negative.   Respiratory: Negative.   Cardiovascular: Negative.   Gastrointestinal: Negative.   Genitourinary: Negative.   Musculoskeletal: Negative.   Skin: Negative.   Neurological: Negative.   Endo/Heme/Allergies: Negative.   Psychiatric/Behavioral: The patient is nervous/anxious.        Agitation    Blood pressure 118/81, pulse 60, temperature 98.1 F (36.7 C), temperature source Oral, resp. rate 18, height 5\' 3"  (1.6 m), weight 46.7 kg.Body mass index is 18.25 kg/m.  General Appearance: Casual  Eye Contact:  Good  Speech:  Clear and Coherent and Normal Rate  Volume:  Normal  Mood:  Euthymic  Affect:  Congruent  Thought Process:  Goal Directed and Descriptions of Associations: Intact  Orientation:  Full (Time, Place, and Person)  Thought Content:  WDL  Suicidal Thoughts:  No  Homicidal Thoughts:  No  Memory:  Immediate;   Good Recent;   Good Remote;   Good  Judgement:  Fair  Insight:  Fair  Psychomotor Activity:  Normal  Concentration:  Concentration: Good and Attention Span: Good  Recall:  Good  Fund of  Knowledge:  Good  Language:  Good  Akathisia:  No  Handed:  Right  AIMS (if indicated):     Assets:  Communication Skills Desire for Improvement Financial Resources/Insurance Housing Physical Health Social Support Transportation  ADL's:  Intact  Cognition:  WNL  Sleep:  Number of Hours: 5.75   Problems addressed MDD severe recurrent  Treatment Plan Summary: Daily contact with patient to assess and evaluate symptoms and progress in treatment, Medication management and Plan is to: Discontinue Librium Continue Lexapro 10 mg p.o. daily for mood stability Continue Neurontin 300 mg p.o. 3 times daily for mood stability Continue Keppra 500 mg p.o. twice daily for seizure disorder Discontinue trazodone Start Seroquel 50 mg p.o. nightly for mood stability and agitation Start Seroquel 25 mg p.o. twice daily as needed for agitation Increase Vistaril to 50 mg p.o. every 6 hours as needed for anxiety Encourage group therapy participation  Maryfrances Bunnell, FNP 10/13/2018, 2:10 PM   .Agree with NP Progress Note

## 2018-10-14 MED ORDER — LORAZEPAM 1 MG PO TABS: 1.0000 mg | ORAL_TABLET | Freq: Three times a day (TID) | ORAL | Status: DC | PRN

## 2018-10-14 MED ORDER — QUETIAPINE FUMARATE 100 MG PO TABS
100.0000 mg | ORAL_TABLET | Freq: Every day | ORAL | Status: DC
  Administered 2018-10-14: 100 mg via ORAL
  Filled 2018-10-14 (×3): qty 1

## 2018-10-14 MED ORDER — QUETIAPINE FUMARATE 50 MG PO TABS
50.0000 mg | ORAL_TABLET | Freq: Two times a day (BID) | ORAL | Status: DC
  Administered 2018-10-14 – 2018-10-15 (×2): 50 mg via ORAL
  Filled 2018-10-14 (×6): qty 1

## 2018-10-14 MED ORDER — QUETIAPINE FUMARATE 25 MG PO TABS
25.0000 mg | ORAL_TABLET | Freq: Two times a day (BID) | ORAL | Status: DC
  Filled 2018-10-14 (×3): qty 1

## 2018-10-14 MED ORDER — QUETIAPINE FUMARATE 25 MG PO TABS
25.0000 mg | ORAL_TABLET | ORAL | Status: DC
  Filled 2018-10-14 (×2): qty 1

## 2018-10-14 NOTE — Progress Notes (Addendum)
[facility] MD Progress Note  10/14/2018 10:52 AM JAQLYN GRUENHAGEN  MRN:  [id number]   Subjective: Patient describes partial improvement compared to admission.  She denies medication side effects and she feels that Seroquel has been partially effective.  States "I think the dose needs to be higher".  Denies suicidal ideations.   Objective: I have discussed case with treatment team and have met with patient. 37 year old female, history of depression, anxiety, presented to ED following suicide attempt by overdosing on Wellbutrin . 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 She reports history of intermittent explosiveness, angry outbursts. Patient is reporting partial improvement compared to admission but states "I do not think I am ready for discharge yet  "' and describes lingering depression, anxiety.  She has had episodes of angry outbursts during which she becomes loud, subjectively agitated .  She had an episode earlier this morning, responded to staff redirection These episodes are of short duration, after which she feels guilty and apologetic. States " I have them at home also, I guess it is just me". Currently is tolerating medications well, endorses partial improvement, states "I like to Seroquel ,it helps me feel calmer". She denies side effects. Denies suicidal ideations, denies any homicidal or violent ideations.  She has had no seizures or seizure like activity on unit and is not currently presenting with symptoms of withdrawal-no tremors, no diaphoresis, no psychomotor agitation, no visual disturbances, vitals are stable.    Principal Problem: MDD (major depressive disorder), recurrent episode, severe ([place]) Diagnosis:   Patient Active Problem List   Diagnosis Date Noted  . MDD (major depressive disorder), recurrent episode, severe ([place]) [F33.2] 10/10/2018  . Drug overdose, intentional self-harm, initial encounter ([place]) [T50.902A] 10/08/2018  . Seizure disorder ([place]) [T15.726]  10/08/2018  . Depression [F32.9] 10/08/2018   Total Time spent with patient: 20 minutes  Past Psychiatric History: See H&P  Past Medical History:  Past Medical History:  Diagnosis Date  . Seizures ([place])    History reviewed. No pertinent surgical history. Family History: History reviewed. No pertinent family history. Family Psychiatric  History: See H&P Social History:  Social History   Substance and Sexual Activity  Alcohol Use No     Social History   Substance and Sexual Activity  Drug Use No    Social History   Socioeconomic History  . Marital status: Married    Spouse name: Not on file  . Number of children: Not on file  . Years of education: Not on file  . Highest education level: Not on file  Occupational History  . Not on file  Social Needs  . Financial resource strain: Not on file  . Food insecurity:    Worry: Not on file    Inability: Not on file  . Transportation needs:    Medical: Not on file    Non-medical: Not on file  Tobacco Use  . Smoking status: Former Smoker    Packs/day: 0.03    Years: 0.00    Pack years: 0.00  . Smokeless tobacco: Never Used  Substance and Sexual Activity  . Alcohol use: No  . Drug use: No  . Sexual activity: Yes    Birth control/protection: None  Lifestyle  . Physical activity:    Days per week: Not on file    Minutes per session: Not on file  . Stress: Not on file  Relationships  . Social connections:    Talks on phone: Not on file  Gets together: Not on file    Attends religious service: Not on file    Active member of club or organization: Not on file    Attends meetings of clubs or organizations: Not on file    Relationship status: Not on file  Other Topics Concern  . Not on file  Social History Narrative  . Not on file   Additional Social History:   Sleep: Good  Appetite:  Good  Current Medications: Current Facility-Administered Medications  Medication Dose Route Frequency Provider Last Rate  Last Dose  . alum & mag hydroxide-simeth (MAALOX/MYLANTA) 200-200-20 MG/5ML suspension 30 mL  30 mL Oral Q4H PRN Patriciaann Clan E, PA-C   30 mL at 10/10/18 1357  . dicyclomine (BENTYL) tablet 20 mg  20 mg Oral Q6H PRN Laverle Hobby, PA-C   20 mg at 10/13/18 2216  . escitalopram (LEXAPRO) tablet 10 mg  10 mg Oral Daily Sharma Covert, MD   10 mg at 10/14/18 0750  . gabapentin (NEURONTIN) capsule 300 mg  300 mg Oral TID Braeton Wolgamott, Myer Peer, MD   300 mg at 10/14/18 0750  . hydrOXYzine (ATARAX/VISTARIL) tablet 50 mg  50 mg Oral Q6H PRN Money, Lowry Ram, FNP      . levETIRAcetam (KEPPRA) tablet 500 mg  500 mg Oral BID Sharma Covert, MD   500 mg at 10/14/18 0750  . loperamide (IMODIUM) capsule 2-4 mg  2-4 mg Oral PRN Patriciaann Clan E, PA-C      . magnesium hydroxide (MILK OF MAGNESIA) suspension 30 mL  30 mL Oral Daily PRN Laverle Hobby, PA-C      . methocarbamol (ROBAXIN) tablet 500 mg  500 mg Oral Q8H PRN Patriciaann Clan E, PA-C   500 mg at 10/13/18 2216  . naproxen (NAPROSYN) tablet 500 mg  500 mg Oral BID PRN Patriciaann Clan E, PA-C   500 mg at 10/14/18 0751  . nicotine (NICODERM CQ - dosed in mg/24 hours) patch 21 mg  21 mg Transdermal Daily Patriciaann Clan E, PA-C   21 mg at 10/14/18 0751  . QUEtiapine (SEROQUEL) tablet 100 mg  100 mg Oral QHS Johniece Hornbaker A, MD      . QUEtiapine (SEROQUEL) tablet 25 mg  25 mg Oral BID Rudean Icenhour, Myer Peer, MD        Lab Results: No results found for this or any previous visit (from the past 48 hour(s)).  Blood Alcohol level:  Lab Results  Component Value Date   ETH <10 10/08/2018   ETH <11 [id number]/45/0388    Metabolic Disorder Labs: No results found for: HGBA1C, MPG No results found for: PROLACTIN No results found for: CHOL, TRIG, HDL, CHOLHDL, VLDL, LDLCALC  Physical Findings: AIMS: Facial and Oral Movements Muscles of Facial Expression: None, normal Lips and Perioral Area: None, normal Jaw: None, normal Tongue: None, normal,Extremity  Movements Upper (arms, wrists, hands, fingers): None, normal Lower (legs, knees, ankles, toes): None, normal, Trunk Movements Neck, shoulders, hips: None, normal, Overall Severity Severity of abnormal movements (highest score from questions above): None, normal Incapacitation due to abnormal movements: None, normal Patient's awareness of abnormal movements (rate only patient's report): No Awareness, Dental Status Current problems with teeth and/or dentures?: No Does patient usually wear dentures?: No  CIWA:  CIWA-Ar Total: 6 COWS:  COWS Total Score: 0  Musculoskeletal: Strength & Muscle Tone: within normal limits Gait & Station: normal Patient leans: N/A  Psychiatric Specialty Exam: Physical Exam  Nursing note and vitals reviewed.  Constitutional: She is oriented to person, place, and time. She appears well-developed and well-nourished.  Cardiovascular: Normal rate.  Respiratory: Effort normal.  Musculoskeletal: Normal range of motion.  Neurological: She is alert and oriented to person, place, and time.  Skin: Skin is warm.    Review of Systems  Constitutional: Negative.   HENT: Negative.   Eyes: Negative.   Respiratory: Negative.   Cardiovascular: Negative.   Gastrointestinal: Negative.   Genitourinary: Negative.   Musculoskeletal: Negative.   Skin: Negative.   Neurological: Negative.   Endo/Heme/Allergies: Negative.   Psychiatric/Behavioral: The patient is nervous/anxious.        Agitation  No chest pain, no shortness of breath, no vomiting  Blood pressure 113/87, pulse 71, temperature (!) 97.3 F (36.3 C), resp. rate 16, height _0  (1.6 m), weight 46.7 kg.Body mass index is 18.25 kg/m.  General Appearance: Improving grooming  Eye Contact:  Good  Speech:  Normal Rate  Volume:  Normal  Mood:  Reports partially improved mood, acknowledges feeling better than she did prior to admission  Affect:  Currently calm, appropriate affect-as above has had brief episodes of  agitation/anger  Thought Process:  Linear and Descriptions of Associations: Intact  Orientation:  Other:  Fully alert and attentive  Thought Content:  No hallucinations, no delusions expressed  Suicidal Thoughts:  No-denies suicidal or self-injurious ideations, denies homicidal or violent ideations  Homicidal Thoughts:  No  Memory:  Recent and remote grossly intact  Judgement:  Fair  Insight:  Fair  Psychomotor Activity:  Normal  Concentration:  Concentration: Good and Attention Span: Good  Recall:  Good  Fund of Knowledge:  Good  Language:  Good  Akathisia:  No  Handed:  Right  AIMS (if indicated):     Assets:  Communication Skills Desire for Improvement Financial Resources/Insurance Housing Physical Health Social Support Transportation  ADL's:  Intact  Cognition:  WNL  Sleep:  Number of Hours: 6.25   Assessment -  37 year old female, history of depression, anxiety, presented to ED following suicide attempt by overdosing on Wellbutrin. 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 She reports history of intermittent explosiveness, angry outbursts.  Patient presents with partially improved mood and range of affect.  Describes some lingering depression, anxiety, and describes a history of intermittent explosiveness/angry outbursts.  She has had some of these episodes on unit although has generally been redirectable by staff.  States she feels current medications are well-tolerated and in particular identifies Seroquel as helpful.  Side effects have been reviewed.  She is not currently presenting with any significant withdrawal symptoms and does not appear to be in any acute distress or discomfort. We have reviewed history of seizures-patient reported seizure-like episode in the past apparently in the context of discontinuing or decreasing Xanax.  She was started on Keppra by her outpatient provider prior to admission and had been taking it without side effects.  She states  she has not had an EEG or neurology assessment.  We have reviewed importance of following up with PCP and considering neurology consultation for further work-up and management.  Treatment Plan Summary: Daily contact with patient to assess and evaluate symptoms and progress in treatment, Medication management and Plan is to: Treatment plan reviewed as below today 10/31. Continue Lexapro 10 mg QDAY for depression, anxiety  Continue Neurontin 300 mg TID for anxiety, pain Continue Keppra 500 mg BID for history of seizures  Increase Seroquel to 50 mgrs BID + 100  mgrs QHS for mood disorder D/C Vistaril Start Ativan 1 mgr Q 8 hours PRN  for anxiety or agitation as needed  Encourage group therapy participation to work on coping skills and symptom reduction Check Lipid panel and HgbA1C  Jenne Campus, MD 10/14/2018, 10:52 AM   Patient ID: Erick Blinks, female   DOB: 05/14/1981, 37 y.o.   MRN: [id number]

## 2018-10-14 NOTE — Progress Notes (Addendum)
Patient ID: Brianna Kim, female   DOB: [birth year], 37 y.o.   MRN: [id number]  9:30 AM Patient was attending morning goals group during which time she was involved in a verbal altercation with a peer. When writer entered the group room, patient was yelling, cussing, and making verbal threats to peer. While staff escorted peer out of the group room, patient began to posture towards peer. Patient then sat back down and was compliant with staff directions to stop yelling at peer. Group resumed. Patient was observed continuing to speak with agitation and dominate group discussion. MD and [facility] notified.  Per [facility], a peer was speaking in group when she was told by another peer she was "stupid" and "didn't need medicines". This patient became involved in that exchange and the situation escalated.    Addendum 10:20 AM Patient is now calm and apologetic for her behavior. Patient has been spoken to by MD and [facility]. Patient expressing interest in apologizing to peer. Patient contracts for safety with staff.  Will continue to support and monitor.

## 2018-10-14 NOTE — Progress Notes (Signed)
Patient ID: Brianna Kim, female   DOB: 11/06/1981, 37 y.o.   MRN: [id number]  Nursing Progress Note [id number]  Data: Patient presents with anxiety and complaints of pain in her head, legs, and back this morning. Patient reports she slept well, "actually made it to breakfast", and requested her Seroquel reporting, "it helped me yesterday". Patient was pleasant during interactions but is noted to have a labile mood and can be easily agitated. Patient compliant with scheduled medications. Patient completed self-inventory sheet and rates depression, hopelessness, and anxiety 7,7,5 respectively. Patient rates their sleep and appetite as good/good respectively. Patient states goal for today is to "stay focused and not get upset". Patient is seen attending groups and visible in the milieu. Patient currently denies SI/HI/AVH.   Action: Patient is educated about and provided medication per provider's orders. Patient safety maintained with q15 min safety checks and frequent rounding. High fall risk precautions (seizure history) in place. Emotional support given. 1:1 interaction and active listening provided. Patient encouraged to attend meals, groups, and work on treatment plan and goals. Labs, vital signs and patient behavior monitored throughout shift.   Response: Patient remains safe on the unit at this time and agrees to come to staff with any issues/concerns. Will continue to support and monitor.

## 2018-10-14 NOTE — B[facility] Group Notes (Signed)
Adult Psychoeducational Group Note  Date:  10/14/2018  Time:  4:00 PM  Group Topic/Focus: Recovery Goals/Barriers  Participation Level:  Did Not Attend  Additional Comments:  Patient was invited but did not attend group.  Brianna Kim 10/14/2018 5:30 PM

## 2018-10-14 NOTE — [facility] Group Notes (Signed)
LCSW Group Therapy Note 10/14/2018 12:27 PM  Type of Therapy/Topic: Group Therapy: Emotion Regulation  Participation Level: Active   Description of Group:  The purpose of this group is to assist patients in learning to regulate negative emotions and experience positive emotions. Patients will be guided to discuss ways in which they have been vulnerable to their negative emotions. These vulnerabilities will be juxtaposed with experiences of positive emotions or situations, and patients will be challenged to use positive emotions to combat negative ones. Special emphasis will be placed on coping with negative emotions in conflict situations, and patients will process healthy conflict resolution skills.  Therapeutic Goals: 1. Patient will identify two positive emotions or experiences to reflect on in order to balance out negative emotions 2. Patient will label two or more emotions that they find the most difficult to experience 3. Patient will demonstrate positive conflict resolution skills through discussion and/or role plays  Summary of Patient Progress:  Dezire was engaged and participated throughout the group session. Carlise reports that she struggles with controlling her anger. Latrica reports that she typically lashes out, however that has caused her a lot of trouble. Elsia states that she would like to learn how to use her coping skills to regulate her anger in a better way.    Therapeutic Modalities:  Cognitive Behavioral Therapy Feelings Identification Dialectical Behavioral Therapy   Alcario Drought Clinical Social Worker

## 2018-10-14 NOTE — Plan of Care (Signed)
  Problem: Health Behavior/Discharge Planning: Goal: Identification of resources available to assist in meeting health care needs will improve Outcome: Progressing  Patient agreeable to medication changes to help manage agitation/outbursts.   Problem: Safety: Goal: Periods of time without injury will increase Outcome: Progressing  Patient contracts for safety with staff.

## 2018-10-14 NOTE — Progress Notes (Signed)
Pt seems to be having a better evening than the previous evening.  She reports that the doctor added Seroquel to her medication and it has helped with her mood.  She denies SI/HI/AVH at this time.  She attended evening group.  She had visitors also this evening.  She makes her needs know to staff.  Pt takes her meds as ordered.  Support and encouragement offered.  Discharge plans are in process.  Pt wants to go to long term treatment, but is not sure she will be able to since she had a court date later this month.  Safety maintained with q15 minute checks.

## 2018-10-15 MED ORDER — QUETIAPINE FUMARATE 100 MG PO TABS: 100.0000 mg | ORAL_TABLET | Freq: Every day | ORAL | 0 refills | Status: AC

## 2018-10-15 MED ORDER — ESCITALOPRAM OXALATE 10 MG PO TABS: 10.0000 mg | ORAL_TABLET | Freq: Every day | ORAL | 0 refills | Status: AC

## 2018-10-15 MED ORDER — LEVETIRACETAM 500 MG PO TABS: 500.0000 mg | ORAL_TABLET | Freq: Two times a day (BID) | ORAL | 0 refills | Status: AC

## 2018-10-15 MED ORDER — QUETIAPINE FUMARATE 50 MG PO TABS: 50.0000 mg | ORAL_TABLET | Freq: Two times a day (BID) | ORAL | 0 refills | Status: AC

## 2018-10-15 MED ORDER — GABAPENTIN 300 MG PO CAPS: 300.0000 mg | ORAL_CAPSULE | Freq: Three times a day (TID) | ORAL | 0 refills | Status: AC

## 2018-10-15 NOTE — Progress Notes (Signed)
  [facility] Adult Case Management Discharge Plan :  Will you be returning to the same living situation after discharge:  Yes,  patient reports she is returning home with her husband At discharge, do you have transportation home?: Yes,  patient reports her husband will pick her up at discharge Do you have the ability to pay for your medications: No.  Release of information consent forms completed and in the chart;  Patient's signature needed at discharge.  Patient to Follow up at: Follow-up Information    Services, [facility]. Go on 10/18/2018.   Why:  Appointment for medication management and therapy is Monday, 10/18/18 at 9:00 a.m. Please bring photo ID, Social Secuirty card, proof of insurance and proof of income.  Contact information: [address]  [place] [address] [phone]           Next level of care provider has access to [facility] Link:yes  Safety Planning and Suicide Prevention discussed: Yes,  with the patient  Have you used any form of tobacco in the last 30 days? (Cigarettes, Smokeless Tobacco, Cigars, and/or Pipes): Yes  Has patient been referred to the Quitline?: Patient refused referral  Patient has been referred for addiction treatment: Yes  Maeola Sarah, LCSWA 10/15/2018, 10:26 AM

## 2018-10-15 NOTE — Progress Notes (Signed)
Discharge Note:  Patient discharged home with family member.  Patient denied SI and HI.  Denied A/V hallucinations.  Suicide prevention information given and discussed with patient who stated she understood and had no questions.  My 3 app information given to patient for suicide information.  Patient stated she received all her belongings, clothing, toiletries, misc times, prescriptions, etc.  Patient stated she appreciated all assistance received from [facility] staff.  All required discharge information given to patient at discharge.

## 2018-10-15 NOTE — Tx Team (Signed)
Interdisciplinary Treatment and Diagnostic Plan Update  10/15/2018 Time of Session:  Brianna Kim MRN: [id number]  Principal Diagnosis: MDD (major depressive disorder), recurrent episode, severe (HCC)  Secondary Diagnoses: Principal Problem:   MDD (major depressive disorder), recurrent episode, severe (HCC)   Current Medications:  Current Facility-Administered Medications  Medication Dose Route Frequency Provider Last Rate Last Dose  . alum & mag hydroxide-simeth (MAALOX/MYLANTA) 200-200-20 MG/5ML suspension 30 mL  30 mL Oral Q4H PRN Donell Sievert E, PA-C   30 mL at 10/10/18 1357  . escitalopram (LEXAPRO) tablet 10 mg  10 mg Oral Daily Antonieta Pert, MD   10 mg at 10/15/18 0746  . gabapentin (NEURONTIN) capsule 300 mg  300 mg Oral TID Cobos, Rockey Situ, MD   300 mg at 10/15/18 0747  . levETIRAcetam (KEPPRA) tablet 500 mg  500 mg Oral BID Antonieta Pert, MD   500 mg at 10/15/18 0747  . LORazepam (ATIVAN) tablet 1 mg  1 mg Oral Q8H PRN Cobos, Rockey Situ, MD      . magnesium hydroxide (MILK OF MAGNESIA) suspension 30 mL  30 mL Oral Daily PRN Donell Sievert E, PA-C      . nicotine (NICODERM CQ - dosed in mg/24 hours) patch 21 mg  21 mg Transdermal Daily Donell Sievert E, PA-C   21 mg at 10/15/18 0748  . QUEtiapine (SEROQUEL) tablet 100 mg  100 mg Oral QHS Cobos, Rockey Situ, MD   100 mg at 10/14/18 2152  . QUEtiapine (SEROQUEL) tablet 50 mg  50 mg Oral BID Cobos, Rockey Situ, MD   50 mg at 10/15/18 0747   PTA Medications: Medications Prior to Admission  Medication Sig Dispense Refill Last Dose  . ALPRAZolam (XANAX) 0.5 MG tablet Take 0.5 mg by mouth at bedtime as needed for anxiety.     . DULoxetine (CYMBALTA) 30 MG capsule Take 30 mg by mouth daily.   Past Month at Unknown time  . [DISCONTINUED] escitalopram (LEXAPRO) 10 MG tablet Take 10 mg by mouth daily.   Past Month at Unknown time  . SUMAtriptan (IMITREX) 25 MG tablet Take 1 tablet (25 mg total) by mouth daily as needed for  migraine. 10 tablet 0   . [DISCONTINUED] levETIRAcetam (KEPPRA) 500 MG tablet Take 500 mg by mouth 2 (two) times daily.  0 VERIFY    Patient Stressors: Financial difficulties Marital or family conflict Medication change or noncompliance Occupational concerns Substance abuse  Patient Strengths: Wellsite geologist fund of knowledge  Treatment Modalities: Medication Management, Group therapy, Case management,  1 to 1 session with clinician, Psychoeducation, Recreational therapy.   Physician Treatment Plan for Primary Diagnosis: MDD (major depressive disorder), recurrent episode, severe (HCC) Long Term Goal(s): Improvement in symptoms so as ready for discharge Improvement in symptoms so as ready for discharge   Short Term Goals: Ability to identify changes in lifestyle to reduce recurrence of condition will improve Ability to verbalize feelings will improve Ability to disclose and discuss suicidal ideas Ability to demonstrate self-control will improve Ability to identify and develop effective coping behaviors will improve Ability to maintain clinical measurements within normal limits will improve Ability to identify triggers associated with substance abuse/mental health issues will improve Ability to identify changes in lifestyle to reduce recurrence of condition will improve Ability to verbalize feelings will improve Ability to disclose and discuss suicidal ideas Ability to demonstrate self-control will improve Ability to identify and develop effective coping behaviors will improve Ability to maintain clinical measurements within  normal limits will improve Ability to identify triggers associated with substance abuse/mental health issues will improve  Medication Management: Evaluate patient's response, side effects, and tolerance of medication regimen.  Therapeutic Interventions: 1 to 1 sessions, Unit Group sessions and Medication administration.  Evaluation of Outcomes:  Adequate for Discharge  Physician Treatment Plan for Secondary Diagnosis: Principal Problem:   MDD (major depressive disorder), recurrent episode, severe (HCC)  Long Term Goal(s): Improvement in symptoms so as ready for discharge Improvement in symptoms so as ready for discharge   Short Term Goals: Ability to identify changes in lifestyle to reduce recurrence of condition will improve Ability to verbalize feelings will improve Ability to disclose and discuss suicidal ideas Ability to demonstrate self-control will improve Ability to identify and develop effective coping behaviors will improve Ability to maintain clinical measurements within normal limits will improve Ability to identify triggers associated with substance abuse/mental health issues will improve Ability to identify changes in lifestyle to reduce recurrence of condition will improve Ability to verbalize feelings will improve Ability to disclose and discuss suicidal ideas Ability to demonstrate self-control will improve Ability to identify and develop effective coping behaviors will improve Ability to maintain clinical measurements within normal limits will improve Ability to identify triggers associated with substance abuse/mental health issues will improve     Medication Management: Evaluate patient's response, side effects, and tolerance of medication regimen.  Therapeutic Interventions: 1 to 1 sessions, Unit Group sessions and Medication administration.  Evaluation of Outcomes: Adequate for Discharge   RN Treatment Plan for Primary Diagnosis: MDD (major depressive disorder), recurrent episode, severe (HCC) Long Term Goal(s): Knowledge of disease and therapeutic regimen to maintain health will improve  Short Term Goals: Ability to participate in decision making will improve, Ability to verbalize feelings will improve, Ability to disclose and discuss suicidal ideas, Ability to identify and develop effective coping  behaviors will improve and Compliance with prescribed medications will improve  Medication Management: RN will administer medications as ordered by provider, will assess and evaluate patient's response and provide education to patient for prescribed medication. RN will report any adverse and/or side effects to prescribing provider.  Therapeutic Interventions: 1 on 1 counseling sessions, Psychoeducation, Medication administration, Evaluate responses to treatment, Monitor vital signs and CBGs as ordered, Perform/monitor CIWA, COWS, AIMS and Fall Risk screenings as ordered, Perform wound care treatments as ordered.  Evaluation of Outcomes: Adequate for Discharge   LCSW Treatment Plan for Primary Diagnosis: MDD (major depressive disorder), recurrent episode, severe (HCC) Long Term Goal(s): Safe transition to appropriate next level of care at discharge, Engage patient in therapeutic group addressing interpersonal concerns.  Short Term Goals: Engage patient in aftercare planning with referrals and resources  Therapeutic Interventions: Assess for all discharge needs, 1 to 1 time with Social worker, Explore available resources and support systems, Assess for adequacy in community support network, Educate family and significant other(s) on suicide prevention, Complete Psychosocial Assessment, Interpersonal group therapy.  Evaluation of Outcomes: Adequate for Discharge   Progress in Treatment: Attending groups: Yes. Participating in groups: Yes. Taking medication as prescribed: Yes. Toleration medication: Yes. Family/Significant other contact made: No, will contact:  if the patient provides consent Patient understands diagnosis: Yes. Discussing patient identified problems/goals with staff: Yes. Medical problems stabilized or resolved: Yes. Denies suicidal/homicidal ideation: Yes. Issues/concerns per patient self-inventory: No. Other:   New problem(s) identified: None   New Short Term/Long  Term Goal(s): medication stabilization, elimination of SI thoughts, development of comprehensive mental wellness plan.  Patient Goals:  I want to get back to my normal self and be better that I was and not be so depressed.  Discharge Plan or Barriers: CSW will assess for appropriate referrals and possible discharge planning.   Reason for Continuation of Hospitalization: None   Estimated Length of Stay: Discharge, 10/15/18   Attendees: Patient: 10/15/2018 10:49 AM  Physician: Dr. Landry Mellow, MD; Dr. Nehemiah Massed, MD 10/15/2018 10:49 AM  Nursing: Rayfield Citizen.B, RN; Meriam Sprague.Kirtland Bouchard, RN 10/15/2018 10:49 AM  RN Care Manager: Onnie Boer, RN 10/15/2018 10:49 AM  Social Worker: Baldo Daub, LCSWA 10/15/2018 10:49 AM  Recreational Therapist: Juliann Pares 10/15/2018 10:49 AM  Other: Hillery Jacks, NP 10/15/2018 10:49 AM  Other: Serena Colonel, NP 10/15/2018 10:49 AM  Other: 10/15/2018 10:49 AM    Scribe for Treatment Team: Maeola Sarah, LCSWA 10/15/2018 10:49 AM

## 2018-10-15 NOTE — Progress Notes (Signed)
D:  Patient's self inventory sheet, patient has fair sleep, sleep medication helpful.  Good appetite, normal energy level, good concentration.  Denied depression and hopeless, anxiety 7.  Denied withdrawals.  Denied SI.  Denied physical problems.  Physical pain, knee, pain medicine not helpful.  Goal is concentrate on staying positive and handle anger.  Walk away when she gets angry.  Does have discharge plans. A:  Medications administered per MD orders.  Emotional support and encouragement given patient. R:  Denied SI and HI, contracts for safety.  Denied A/V hallucinations.  Safety maintained with 15 minute checks.

## 2018-10-15 NOTE — Progress Notes (Signed)
Pt reports she had a good day, although she did not mention the reported incident with another pt that took place this morning in a group.  Previous RN reported that pt later apologized for her part in the incident.  Pt is slightly better in mood, although she still tends to be loud and contentious on the unit.  She was able to visit with her children this evening.  She denies SI/HI/AVH.  She is aware that the dosage of her Seroquel was increased for tonight, and she is in agreement.  Pt is part of a group of patients this evening that has been loud and disruptive, making annoying sounds repeatedly and making fun of other patients.  When administering her meds, writer encouraged pt not to participate in this type of behavior.  Pt agreed that these behaviors are not mature nor acceptable.  Support and encouragement offered.  Discharge plans are in process.  Safety maintained with q15 minute checks.

## 2018-10-15 NOTE — [facility] Suicide Risk Assessment (Signed)
[facility] Discharge Suicide Risk Assessment   Principal Problem: MDD (major depressive disorder), recurrent episode, severe (HCC) Discharge Diagnoses:  Patient Active Problem List   Diagnosis Date Noted  . MDD (major depressive disorder), recurrent episode, severe (HCC) [F33.2] 10/10/2018  . Drug overdose, intentional self-harm, initial encounter (HCC) [T50.902A] 10/08/2018  . Seizure disorder (HCC) [G40.909] 10/08/2018  . Depression [F32.9] 10/08/2018    Total Time spent with patient: 30 minutes  Musculoskeletal: Strength & Muscle Tone: within normal limits Gait & Station: normal Patient leans: N/A  Psychiatric Specialty Exam: ROS no chest pain, no shortness of breath, no vomiting, no fever, no chills  Blood pressure 136/88, pulse 70, temperature (!) 97.3 F (36.3 C), temperature source Oral, resp. rate 16, height 5\' 3"  (1.6 m), weight 46.7 kg.Body mass index is 18.25 kg/m.  General Appearance: Improved grooming  Eye Contact::  Good  Speech:  Normal Rate409  Volume:  Normal  Mood:  Reports her mood is better, feels "a lot better than when I came in"  Affect:  Appropriate, reactive  Thought Process:  Linear and Descriptions of Associations: Intact  Orientation:  Full (Time, Place, and Person)  Thought Content:  No hallucinations, no delusions, not internally preoccupied  Suicidal Thoughts:  No currently denies suicidal or self-injurious ideations, also denies any homicidal or violent ideations  Homicidal Thoughts:  No  Memory:  Recent and remote grossly intact  Judgement:  Other:  Improving  Insight:  Fair and Improving  Psychomotor Activity:  Normal-no current psychomotor agitation or restlessness  Concentration:  Good  Recall:  Good  Fund of Knowledge:Good  Language: Good  Akathisia:  Negative  Handed:  Right  AIMS (if indicated):   no abnormal movements noted or reported  Assets:  Communication Skills Desire for Improvement Resilience  Sleep:  Number of Hours: 6.25   Cognition: WNL  ADL's:  Intact   Mental Status Per Nursing Assessment::   On Admission:  Self-harm behaviors  Demographic Factors:  37 year old married female  Loss Factors: Marital, family stressors  Historical Factors: History of depression, history of anxiety, history of BZD Abuse, and history of WDL seizures in the past   Risk Reduction Factors:   Sense of responsibility to family, Living with another person, especially a relative and Positive coping skills or problem solving skills  Continued Clinical Symptoms:  At this time patient is alert, attentive, today reports improved mood, and presents with a more reactive affect. No current irritability or expansiveness. No thought disorder, no suicidal or self injurious ideations , no homicidal or violent ideations, no hallucinations , no delusions , not internally preoccupied . Behavior on unit in good control today, denies any further episodes of irritability or anger . Presents pleasant on approach. Denies medication side effects, reports she feels medications are working and that Seroquel has helped her feel better, less irritable- we reviewed medication side effects, to include potential for weight gain, sedation, metabolic, movement disorder.  Cognitive Features That Contribute To Risk:  No gross cognitive deficits noted upon discharge. Is alert , attentive, and oriented x 3   Suicide Risk:  Mild:  Suicidal ideation of limited frequency, intensity, duration, and specificity.  There are no identifiable plans, no associated intent, mild dysphoria and related symptoms, good self-control (both objective and subjective assessment), few other risk factors, and identifiable protective factors, including available and accessible social support.  Follow-up Information    Services, [facility]. Go on 10/18/2018.   Why:  Appointment for medication management and therapy  is Monday, 10/18/18 at 9:00 a.m. Please bring photo ID, Social  Secuirty card, proof of insurance and proof of income.  Contact information: [address] [place] [address] [phone]           Plan Of Care/Follow-up recommendations:  Activity:  as tolerated  Diet:  regular Tests:  NA Other:  See below  Patient plans to return home. Follow up as above. Plans to follow up with her PCP for medical management as needed . I have encouraged her to see an outpatient neurologist for further management and monitoring regarding history of seizures . I have also cautioned patient not to drive unless specifically allowed by PCP or Neurologist .   Craige Cotta, MD 10/15/2018, 11:27 AM

## 2018-10-15 NOTE — Progress Notes (Signed)
Recreation Therapy Notes  Date: 11.1.19 Time: 0930 Location: [address] Dayroom  Group Topic: Stress Management  Goal Area(s) Addresses:  Patient will verbalize importance of using healthy stress management.  Patient will identify positive emotions associated with healthy stress management.   Intervention: Stress Management  Activity :  Guided Imagery.  LRT introduced the stress management technique of guided imagery.  LRT read a script allowing patients to visualize being in a peaceful meadow.  Patients were to listen as script was read to engage in the meditation.  Education:  Stress Management, Discharge Planning.   Education Outcome: Acknowledges edcuation/In group clarification offered/Needs additional education  Clinical Observations/Feedback: Pt did not attend group.      Caroll Rancher, LRT/CTRS         Caroll Rancher A 10/15/2018 11:42 AM

## 2018-10-15 NOTE — Discharge Summary (Addendum)
Physician Discharge Summary Note  Patient:  Brianna Kim is an 37 y.o., female MRN:  [id number] DOB:  November 18, 1981 Patient phone:  [phone] (home)  Patient address:   [address] [place] [address],  Total Time spent with patient: 20 minutes  Date of Admission:  10/10/2018 Date of Discharge: 10/15/18  Reason for Admission:  Worsening depression with intentional overdose  Principal Problem: MDD (major depressive disorder), recurrent episode, severe [facility]) Discharge Diagnoses: Patient Active Problem List   Diagnosis Date Noted  . MDD (major depressive disorder), recurrent episode, severe (HCC) [F33.2] 10/10/2018  . Drug overdose, intentional self-harm, initial encounter (HCC) [T50.902A] 10/08/2018  . Seizure disorder (HCC) [G40.909] 10/08/2018  . Depression [F32.9] 10/08/2018    Past Psychiatric History: One previous suicide attempt as an adolescent.  No previous psychiatric admissions.  She is been followed as an outpatient  Past Medical History:  Past Medical History:  Diagnosis Date  . Seizures (HCC)    History reviewed. No pertinent surgical history. Family History: History reviewed. No pertinent family history. Family Psychiatric  History: Mother reportedly has psychiatric issues Social History:  Social History   Substance and Sexual Activity  Alcohol Use No     Social History   Substance and Sexual Activity  Drug Use No    Social History   Socioeconomic History  . Marital status: Married    Spouse name: Not on file  . Number of children: Not on file  . Years of education: Not on file  . Highest education level: Not on file  Occupational History  . Not on file  Social Needs  . Financial resource strain: Not on file  . Food insecurity:    Worry: Not on file    Inability: Not on file  . Transportation needs:    Medical: Not on file    Non-medical: Not on file  Tobacco Use  . Smoking status: Former Smoker    Packs/day: 0.03    Years: 0.00     Pack years: 0.00  . Smokeless tobacco: Never Used  Substance and Sexual Activity  . Alcohol use: No  . Drug use: No  . Sexual activity: Yes    Birth control/protection: None  Lifestyle  . Physical activity:    Days per week: Not on file    Minutes per session: Not on file  . Stress: Not on file  Relationships  . Social connections:    Talks on phone: Not on file    Gets together: Not on file    Attends religious service: Not on file    Active member of club or organization: Not on file    Attends meetings of clubs or organizations: Not on file    Relationship status: Not on file  Other Topics Concern  . Not on file  Social History Narrative  . Not on file    Hospital Course:   10/10/18 [facility] MD Assessment: 37 year old female with a reported past psychiatric history significant for major depression and generalized anxiety disorder who presented to the [facility] emergency department on 10/08/2018 after an intentional overdose of Wellbutrin. The patient stated that she felt as though her life was falling apart. She stated that over the last 2 weeks she had dressed only in sweatpants and slept a lot. She admitted to helplessness, hopelessness and worthlessness. She had no motivation. She stated that she had been being treated by a nurse practitioner near her home. She stated that she had been on  several antidepressants including Paxil and Zoloft. She stated that neither of these medications worked. She stated she is also been using Xanax. The patient during the interview admitted that she had been abusing Xanax since she was aged 73, and there is a notation in the chart from her husband who reported she was overusing it as well. Her previous nurse practitioner had decided to stop her Xanax. She apparently had "seizures" after that. She recently saw a new nurse practitioner who had restarted the Xanax. The [facility] database shows her last prescription for Xanax was filled  on 08/17/2018. She was given 0.5 mg and 60 tablets. She stated that her marriage was not going well, and she had conflicts there ongoing with her mother. She stated this is what led to much of her depression. She stated that she had been previously treated with Lexapro earlier this year, and had done well with it. She was unclear on why they had switched her to Wellbutrin. He had also wanted to place her on Cymbalta. Her drug screen on admission showed the presence of benzodiazepines, opiates and marijuana. The patient reported that she is only used marijuana times once in the last month. She also stated that she had gotten Valium as well as a Norco tablet from her spouse's medication. She was admitted to the hospital for evaluation and stabilization.  Patient remained on the [facility] unit for 5 days. The patient stabilized on medication and therapy. Patient was discharged on Lexapro 10 mg Daily, neurontin 300 mg TID, Seroquel 100 mg QHS, Seroquel 50 mg BID, and Keppra 500 mg BID. Patient has shown improvement with improved mood, affect, sleep, appetite, and interaction. Patient has attended group and participated. Patient has been seen in the day room interacting with peers and staff appropriately. Patient denies any SI/HI/AVH and contracts for safety. Patient agrees to follow up at [facility], [facility], or [facility]. Patient is provided with prescriptions for their medications upon discharge.   Physical Findings: AIMS: Facial and Oral Movements Muscles of Facial Expression: None, normal Lips and Perioral Area: None, normal Jaw: None, normal Tongue: None, normal,Extremity Movements Upper (arms, wrists, hands, fingers): None, normal Lower (legs, knees, ankles, toes): None, normal, Trunk Movements Neck, shoulders, hips: None, normal, Overall Severity Severity of abnormal movements (highest score from questions above): None, normal Incapacitation due to abnormal movements: None, normal Patient's awareness of  abnormal movements (rate only patient's report): No Awareness, Dental Status Current problems with teeth and/or dentures?: No Does patient usually wear dentures?: No  CIWA:  CIWA-Ar Total: 6 COWS:  COWS Total Score: 0  Musculoskeletal: Strength & Muscle Tone: within normal limits Gait & Station: normal Patient leans: N/A  Psychiatric Specialty Exam: Physical Exam  Nursing note and vitals reviewed. Constitutional: She is oriented to person, place, and time. She appears well-developed and well-nourished.  Cardiovascular: Normal rate.  Respiratory: Effort normal.  Musculoskeletal: Normal range of motion.  Neurological: She is alert and oriented to person, place, and time.  Skin: Skin is warm.    Review of Systems  Constitutional: Negative.   HENT: Negative.   Eyes: Negative.   Respiratory: Negative.   Cardiovascular: Negative.   Gastrointestinal: Negative.   Genitourinary: Negative.   Musculoskeletal: Negative.   Skin: Negative.   Neurological: Negative.   Endo/Heme/Allergies: Negative.   Psychiatric/Behavioral: Negative.     Blood pressure 136/88, pulse 70, temperature (!) 97.3 F (36.3 C), temperature source Oral, resp. rate 16, height 5\' 3"  (1.6 m), weight 46.7 kg.Body mass index  is 18.25 kg/m.  General Appearance: Casual  Eye Contact:  Good  Speech:  Clear and Coherent and Normal Rate  Volume:  Normal  Mood:  Euthymic  Affect:  Congruent  Thought Process:  Goal Directed and Descriptions of Associations: Intact  Orientation:  Full (Time, Place, and Person)  Thought Content:  WDL  Suicidal Thoughts:  No  Homicidal Thoughts:  No  Memory:  Immediate;   Good Recent;   Good Remote;   Good  Judgement:  Fair  Insight:  Good  Psychomotor Activity:  Normal  Concentration:  Concentration: Good and Attention Span: Good  Recall:  Good  Fund of Knowledge:  Good  Language:  Good  Akathisia:  No  Handed:  Right  AIMS (if indicated):     Assets:  Communication  Skills Desire for Improvement Financial Resources/Insurance Housing Physical Health Social Support Transportation  ADL's:  Intact  Cognition:  WNL  Sleep:  Number of Hours: 6.25     Have you used any form of tobacco in the last 30 days? (Cigarettes, Smokeless Tobacco, Cigars, and/or Pipes): Yes  Has this patient used any form of tobacco in the last 30 days? (Cigarettes, Smokeless Tobacco, Cigars, and/or Pipes) Yes, Yes, A prescription for an FDA-approved tobacco cessation medication was offered at discharge and the patient refused  Blood Alcohol level:  Lab Results  Component Value Date   [facility] <10 10/08/2018   ETH <11 07/13/2013    Metabolic Disorder Labs:  No results found for: HGBA1C, MPG No results found for: PROLACTIN No results found for: CHOL, TRIG, HDL, CHOLHDL, VLDL, LDLCALC  See Psychiatric Specialty Exam and Suicide Risk Assessment completed by Attending Physician prior to discharge.  Discharge destination:  Home  Is patient on multiple antipsychotic therapies at discharge:  No   Has Patient had three or more failed trials of antipsychotic monotherapy by history:  No  Recommended Plan for Multiple Antipsychotic Therapies: NA   Allergies as of 10/15/2018   No Known Allergies     Medication List    STOP taking these medications   ALPRAZolam 0.5 MG tablet Commonly known as:  XANAX   DULoxetine 30 MG capsule Commonly known as:  CYMBALTA   SUMAtriptan 25 MG tablet Commonly known as:  IMITREX     TAKE these medications     Indication  escitalopram 10 MG tablet Commonly known as:  LEXAPRO Take 1 tablet (10 mg total) by mouth daily. For mood control What changed:  additional instructions  Indication:  mood stability   gabapentin 300 MG capsule Commonly known as:  NEURONTIN Take 1 capsule (300 mg total) by mouth 3 (three) times daily. For mood control  Indication:  mood stability   levETIRAcetam 500 MG tablet Commonly known as:  KEPPRA Take 1  tablet (500 mg total) by mouth 2 (two) times daily.  Indication:  Seizure   QUEtiapine 50 MG tablet Commonly known as:  SEROQUEL Take 1 tablet (50 mg total) by mouth 2 (two) times daily. For mood control  Indication:  Agitation   QUEtiapine 100 MG tablet Commonly known as:  SEROQUEL Take 1 tablet (100 mg total) by mouth at bedtime. For mood control  Indication:  mood stability      Follow-up Information    [facility] Follow up.   Specialty:  Addiction Medicine Why:  Referral made 10/12/18 Contact information: [address] [place] [address] [phone]        [facility]. Go to.  Why:  Please follow up for outpatient medication management and therapy services. Walk in hours are Monday- Friday from 8:00am-5:00pm. Please be sure to bring your Photo ID, SSN, any insurance information and any discharge paperwork from this hospitalization.  Contact information: [address] [place] [address] [phone]        Services, [facility]. Go on 10/18/2018.   Why:  Your [facility] walk in appointment is Monday, November 4 at 9:00 a.m. Please bring photo ID, Social Secuirty card, proof of insurance and proof of income.  Contact information: [address] [place] [address] [phone]           Follow-up recommendations:  Continue activity as tolerated. Continue diet as recommended by your PCP. Ensure to keep all appointments with outpatient providers.  Comments:  Patient is instructed prior to discharge to: Take all medications as prescribed by his/her mental healthcare provider. Report any adverse effects and or reactions from the medicines to his/her outpatient provider promptly. Patient has been instructed & cautioned: To not engage in alcohol and or illegal drug use while on prescription medicines. In the event of worsening symptoms, patient is instructed to call the crisis hotline, 911 and or go to the nearest ED for appropriate  evaluation and treatment of symptoms. To follow-up with his/her primary care provider for your other medical issues, concerns and or health care needs.    Signed: Gerlene Burdock Money, FNP 10/15/2018, 10:21 AM   Patient seen, Suicide Assessment Completed.  Disposition Plan Reviewed

## 2018-10-15 NOTE — Progress Notes (Signed)
Patient wanted to take her 5:00 p.m. seroquel before she left.  Patient stated "There is really some crazy people in here.  I need to leave here."
